# Patient Record
Sex: Male | Born: 1960
Health system: Southern US, Community
[De-identification: ages and names within clinical notes are randomized; demographics above are authoritative.]

## PROBLEM LIST (undated history)

## (undated) DIAGNOSIS — M2042 Other hammer toe(s) (acquired), left foot: Secondary | ICD-10-CM

## (undated) DIAGNOSIS — I10 Essential (primary) hypertension: Secondary | ICD-10-CM

## (undated) HISTORY — DX: Essential (primary) hypertension: I10

## (undated) HISTORY — PX: CARDIAC CATHETERIZATION: SHX172

---

## 1994-12-28 HISTORY — PX: VASECTOMY: SHX75

## 2004-12-28 DIAGNOSIS — I10 Essential (primary) hypertension: Secondary | ICD-10-CM | POA: Insufficient documentation

## 2005-12-16 DIAGNOSIS — E781 Pure hyperglyceridemia: Secondary | ICD-10-CM | POA: Insufficient documentation

## 2005-12-28 HISTORY — PX: FOOT SURGERY: SHX648

## 2007-09-13 DIAGNOSIS — Z8249 Family history of ischemic heart disease and other diseases of the circulatory system: Secondary | ICD-10-CM | POA: Insufficient documentation

## 2009-11-05 ENCOUNTER — Ambulatory Visit: Payer: Self-pay | Admitting: Unknown Physician Specialty

## 2009-11-27 ENCOUNTER — Ambulatory Visit: Payer: Self-pay | Admitting: Unknown Physician Specialty

## 2009-12-28 ENCOUNTER — Ambulatory Visit: Payer: Self-pay | Admitting: Unknown Physician Specialty

## 2012-10-07 ENCOUNTER — Ambulatory Visit: Payer: Self-pay | Admitting: Internal Medicine

## 2012-10-10 ENCOUNTER — Ambulatory Visit: Payer: Self-pay | Admitting: Internal Medicine

## 2012-10-10 LAB — CBC WITH DIFFERENTIAL/PLATELET
Basophil #: 0.1 10*3/uL (ref 0.0–0.1)
Basophil %: 0.9 %
Eosinophil #: 0.2 10*3/uL (ref 0.0–0.7)
HCT: 44.1 % (ref 40.0–52.0)
HGB: 15.6 g/dL (ref 13.0–18.0)
Lymphocyte #: 2.2 10*3/uL (ref 1.0–3.6)
MCH: 30.2 pg (ref 26.0–34.0)
MCHC: 35.5 g/dL (ref 32.0–36.0)
MCV: 85 fL (ref 80–100)
Monocyte #: 0.6 x10 3/mm (ref 0.2–1.0)
Neutrophil #: 4.3 10*3/uL (ref 1.4–6.5)
RDW: 12.8 % (ref 11.5–14.5)

## 2012-10-12 ENCOUNTER — Ambulatory Visit: Payer: Self-pay | Admitting: Internal Medicine

## 2012-10-12 HISTORY — PX: CORONARY STENT PLACEMENT: SHX1402

## 2012-10-14 ENCOUNTER — Observation Stay: Payer: Self-pay | Admitting: Internal Medicine

## 2012-10-14 LAB — CBC
HCT: 43.1 % (ref 40.0–52.0)
HGB: 15.2 g/dL (ref 13.0–18.0)
MCH: 30 pg (ref 26.0–34.0)
MCHC: 35.2 g/dL (ref 32.0–36.0)
RDW: 13 % (ref 11.5–14.5)

## 2012-10-14 LAB — COMPREHENSIVE METABOLIC PANEL
Albumin: 4 g/dL (ref 3.4–5.0)
Anion Gap: 9 (ref 7–16)
BUN: 15 mg/dL (ref 7–18)
Bilirubin,Total: 0.5 mg/dL (ref 0.2–1.0)
Creatinine: 1.02 mg/dL (ref 0.60–1.30)
EGFR (Non-African Amer.): >60
Glucose: 105 mg/dL — ABNORMAL HIGH (ref 65–99)
Osmolality: 282 (ref 275–301)
Potassium: 3.6 mmol/L (ref 3.5–5.1)
SGOT(AST): 19 U/L (ref 15–37)
Sodium: 141 mmol/L (ref 136–145)
Total Protein: 7.1 g/dL (ref 6.4–8.2)

## 2012-10-14 LAB — CK TOTAL AND CKMB (NOT AT ARMC)
CK, Total: 60 U/L (ref 35–232)
CK-MB: 1.2 ng/mL (ref 0.5–3.6)

## 2012-10-14 LAB — APTT: Activated PTT: 30 secs (ref 23.6–35.9)

## 2012-10-14 LAB — PROTIME-INR: Prothrombin Time: 12.9 secs (ref 11.5–14.7)

## 2012-10-14 LAB — TROPONIN I: Troponin-I: 0.32 ng/mL — ABNORMAL HIGH

## 2012-10-15 LAB — CK TOTAL AND CKMB (NOT AT ARMC)
CK, Total: 50 U/L (ref 35–232)
CK-MB: 1 ng/mL (ref 0.5–3.6)

## 2012-10-15 LAB — TROPONIN I: Troponin-I: 0.27 ng/mL — ABNORMAL HIGH

## 2014-01-12 LAB — BASIC METABOLIC PANEL
BUN: 20 mg/dL (ref 4–21)
Creatinine: 1 mg/dL (ref 0.6–1.3)
Glucose: 106 mg/dL
Potassium: 4.2 mmol/L (ref 3.4–5.3)
Sodium: 142 mmol/L (ref 137–147)

## 2014-01-12 LAB — LIPID PANEL
Cholesterol: 134 mg/dL (ref 0–200)
HDL: 43 mg/dL (ref 35–70)
LDL CALC: 65 mg/dL
TRIGLYCERIDES: 128 mg/dL (ref 40–160)

## 2014-01-12 LAB — HEPATIC FUNCTION PANEL: ALT: 52 U/L — AB (ref 10–40)

## 2014-01-12 LAB — HEMOGLOBIN A1C: Hgb A1c MFr Bld: 5.6 % (ref 4.0–6.0)

## 2015-04-16 NOTE — H&P (Signed)
PATIENT NAME:  Sergio King, Sergio King MR#:  [id number] DATE OF BIRTH:  [birth year]  DATE OF ADMISSION:  10/14/2012  PRIMARY CARE PHYSICIAN: Dr. Caryn Section  ED REFERRING PHYSICIAN: Dr. Renard Hamper  CARDIOLOGIST:  Dr. Clayborn Bigness and [place]: Chest pain.   HISTORY OF PRESENT ILLNESS: The patient is a 54 year old Caucasian male with history of hypertension and hyperlipidemia who underwent a cardiac catheterization due to a positive functional study on the 16th. His cardiac catheterization showed he had a 99% mid LAD lesion as well as a 75% first diagonal lesion. There was a distal circumflex 40% lesion as well as another 50% stenosis of the mid circumflex. The patient was transferred to [facility] because he was felt to be a high stent risk and there was a question of whether he needed a CABG or not. The patient went to [facility] and had a stent placed. He was discharged yesterday evening. He reports that he was doing fine yesterday and then this morning he was doing okay. He did not exert himself or do anything strenuous. He started having some cough, and then after having the cough he started having dull chest pain similar to his episode that caused him to have the work-up. It  was on the left side of his chest. He also had pain radiation to the left arm. His symptoms continued for an hour and a half. The patient came to the Emergency Department and received nitroglycerin, nitro patch. Currently he reports that his chest pain is mostly resolved. He does feel a little lightheaded in his head. He did not have any shortness of breath. No nausea or any other symptoms.   PAST MEDICAL HISTORY:  1. Coronary artery disease with two stents recently.  2. Hypertension.  3. Hyperlipidemia.  4. History of bronchitis.  5. Chronic anxiety.   PAST SURGICAL HISTORY:  1. Status post toe surgery.  2. Status post vasectomy.   ALLERGIES: None.   CURRENT MEDICATIONS:  1. Aspirin 81 mg 1 tab p.o. daily.   2. Atenolol chlorthalidone 50/25, 1/2 tab p.o. daily.  3. Atorvastatin 40 mg at bedtime.  4. Brilinta 90 mg, 1 tab p.o. b.i.d.   SOCIAL HISTORY: Does not smoke. Does not drink. Works as a Clinical cytogeneticist at [employer].   FAMILY HISTORY: Positive for coronary artery disease, myocardial infarction, hypertension, hyperlipidemia, congestive heart failure, and CABG.   REVIEW OF SYSTEMS: CONSTITUTIONAL: Denies any fevers, fatigue, or weakness. Chest pain as above. No weight loss. No weight gain.  EYES: No blurred or double vision. No pain. No redness. No glaucoma. No cataracts. ENT: No tinnitus. No ear pain. No hearing loss. No seasonal or year-round allergies. No epistaxis. No nasal discharge. No snoring. No postnasal drip. No difficulty swallowing. RESPIRATORY: Has intermittent cough. No wheezing. No hemoptysis. No dyspnea. No chronic obstructive pulmonary disease. No tuberculosis. CARDIOVASCULAR: Chest pain as above. No orthopnea. No edema. No arrhythmia. No syncope. GI: No nausea, vomiting, diarrhea. No abdominal pain. No hematemesis. No melena. No changes in bowel habits. No rectal bleeding. GU: Denies any dysuria, hematuria, renal calculus, or frequency. ENDO: Denies any polyuria, nocturia, or thyroid problems. HEME/LYMPH: Denies anemia, easy bruisability, or bleeding. SKIN: No acne. No rash. No changes in mole, hair, or skin. MUSCULOSKELETAL: Denies any pain in the neck, back, or shoulder. NEURO: No numbness. No cerebrovascular accident. No transient ischemic attack. No seizures. PSYCH: Has a history of anxiety. No insomnia. No ADD. No OCD.   PHYSICAL EXAMINATION:  VITAL SIGNS: Temperature 98.6, pulse 55, respirations 18, blood pressure 150/82, O2 100%.   GENERAL: The patient is a 54 year old white male who recently underwent cardiac catheterization with two stents, comes in with chest pain.   EVALUATION:  EKG shows no acute ST-T wave changes with normal sinus rhythm.   ASSESSMENT AND PLAN:  1. The  patient is a 54 year old white male who recently had two stents placed, comes in with chest pain. It is very unlikely this early for it to be related to any stent issues such as collapse or stenosis. At this time we will continue his aspirin and Brilinta. Dr. Clayborn Bigness has already seen the patient in the Emergency Department. He will reevaluate in the morning to see if any further evaluation needs to be done. We will follow his cardiac enzymes overnight. We will continue nitroglycerin.  2. Hypertension. Continue atenolol and  HCTZ as taking at home.  3. Hyperlipidemia. Continue atorvastatin.  4. Miscellaneous. The patient is ambulatory. We will hold off on any Lovenox or heparin.     TIME SPENT: 32 minutes.   ____________________________ Lafonda Mosses Posey Pronto, MD shp:bjt D: 10/14/2012 16:56:06 ET T: 10/14/2012 18:00:07 ET JOB#: [id number]  cc: Larken Urias H. Posey Pronto, MD, <Dictator> Kirstie Peri. Caryn Section, MD Alric Seton MD ELECTRONICALLY SIGNED 10/14/2012 19:20

## 2015-04-16 NOTE — Consult Note (Signed)
PATIENT NAME:  Sergio King, Sergio King MR#:  [id number] DATE OF BIRTH:  [birth year]  DATE OF CONSULTATION:  10/14/2012  REFERRING PHYSICIAN:  Dr. Renard Hamper, Emergency Room  CONSULTING PHYSICIAN:  Dwayne D. Callwood, MD  INDICATION FOR CONSULTATION: Unstable angina.    PRIMARY CARE PHYSICIAN: Dr. Caryn Section   HISTORY OF PRESENT ILLNESS: Sergio King is a 54 year old white male recently seen by me with known coronary disease, had cardiac cath that showed high-grade complex stenosis of the LAD. Because of the complex nature, he was transferred to [facility] for intervention. Dr. Humphrey Rolls at [facility] intervened with a complex proximal LAD involving diagonal one. The patient did reasonably well with a DES stent and went home on the 17th. On the morning of the 18th the patient complained of chest pain and angina which was of concern. He had some diaphoresis. He came to the Emergency Room and was admitted for further evaluation and care. I saw the patient in the Emergency Room for cardiac evaluation and questioned whether he needed emergent cardiac cath or emergent intervention.   REVIEW OF SYSTEMS: No blackout spells or syncope. Mild nausea. No vomiting. No fever. No chills. No weight loss or weight gain. No hemoptysis or hematemesis. No bright red blood per rectum.   PAST MEDICAL HISTORY:  1. Hypertension. 2. Coronary artery disease. 3. Bronchitis. 4. Anxiety.   FAMILY HISTORY: Coronary artery disease, myocardial infarction, angina, hypertension, hyperlipidemia, congestive heart failure, coronary artery disease, PTCA, stent, coronary artery bypass.   SOCIAL HISTORY: Divorced. Children. Still works for [employer].   PAST SURGICAL HISTORY:  1. Toe surgery. 2. Vasectomy.  MEDICATIONS:  1. Atenolol/hydrochlorothiazide 50/25 twice a day.  2. Brilinta 90 mg twice a day.  3. Fluconazole 150 a day.  4. Lorazepam 0.5 p.r.n.  5. Fish Oil. 6. He is also on a statin.   LABORATORY, DIAGNOSTIC, AND RADIOLOGICAL DATA: MET-B, CBC  unremarkable.   ASSESSMENT:  1. Unstable angina.  2. Angina.  3. Chest pain.  4. Hypertension.  5. Positive functional study.  6. Coronary artery disease.  7. Hyperlipidemia.   PLAN:  1. Continue current medications.  2. Admit.  3. Rule out for myocardial infarction with cardiac enzymes.  4. Do not recommend cardiac cath. 5. Do not recommend any further evaluation. 6. Would continue medical therapy for now.  7. I suspect this is anxiety and not true unstable angina. EKG is unremarkable. If the patient rules out, would continue to treat the patient medically. For now do not recommend cardiac cath unless the patient has persistent or recurrent symptoms.    ____________________________ Loran Senters Clayborn Bigness, MD ddc:drc D: 10/15/2012 19:04:40 ET T: 10/16/2012 12:16:44 ET JOB#: [id number]  cc: Dwayne D. Clayborn Bigness, MD, <Dictator> Yolonda Kida MD ELECTRONICALLY SIGNED 11/15/2012 12:47

## 2015-04-16 NOTE — H&P (Signed)
PATIENT NAME:  Sergio King, Sergio King MR#:  [id number] DATE OF BIRTH:  06-05-1961  DATE OF ADMISSION:  10/12/2012  REFERRING PHYSICIAN: Lelon Huh, MD  DATE OF CATHETERIZATION: 10/12/2012.  INDICATION: Angina, positive functional study.  HISTORY OF PRESENT ILLNESS: Mr. Clouse is a 54 year old white male with a strong family history of coronary artery disease and hypertension who recently complained of anginal symptoms and chest pain with exertion. Symptoms began about 3 to 4 weeks ago and have gotten progressively worse. Symptoms are worse with activity like mowing the grass or during sexual intercourse. He has significant chest pressure which only resolves with rest. He has had bilateral arm pain and numbness. No pain at rest, no blackout spells or syncope. The pain is usually moderate and once he stops and rests the pain usually resolves. No prior cardiac history.   REVIEW OF SYSTEMS: No blackout spells or syncope. No nausea or vomiting. No fever, no chills, and no sweats. No weight loss, weight gain, hemoptysis, or hematemesis. No bright red blood per rectum.   PAST MEDICAL HISTORY:  1. Hypertension. 2. Bronchitis.  3. Anxiety.   FAMILY HISTORY: Positive for coronary artery disease, myocardial infarction, angina, hypertension, hyperlipidemia, congestive heart failure, coronary artery bypass, PTCA and stent.   SOCIAL HISTORY: Divorced. Children. Still works as a Clinical cytogeneticist for [employer].   PAST SURGICAL HISTORY:  1. Toe surgery.  2. Vasectomy.   MEDICATIONS:  1. Atenolol/HCTZ 50/25 mg once a day. 2. Fluconazole 150. 3. Lorazepam 0.5 mg p.r.n.  4. Fish oil.  LABS: MET-B and CBC are unremarkable.   ASSESSMENT:  1. Angina.  2. Chest pain.  3. Hypertension.  4. Positive functional study. 5. Family history of coronary artery disease.   PLAN: We will directly proceed with cardiac catheterization with intention to treat with positive functional study with anterior apical ischemia. We  will probably proceed with continued therapy with blood pressure, probably needs aspirin. He will ultimately need to be on statin therapy. Recommend weight loss and exercise. We will base further evaluation on the cardiac catheterization. We will hopefully be able to address his coronary artery disease mechanically, hopefully percutaneously, and then develop further treatment options medically afterward.  ____________________________ Loran Senters. Clayborn Bigness, MD ddc:slb D: 10/12/2012 13:06:58 ET T: 10/12/2012 13:30:33 ET JOB#: [id number]  cc: Dwayne D. Clayborn Bigness, MD, <Dictator> Yolonda Kida MD ELECTRONICALLY SIGNED 11/15/2012 12:46

## 2015-04-16 NOTE — Discharge Summary (Signed)
PATIENT NAME:  King, Sergio MR#:  [id number] DATE OF BIRTH:  1961/04/05  DATE OF ADMISSION:  10/14/2012 DATE OF DISCHARGE:  10/15/2012  PRIMARY CARE PHYSICIAN: Dr. Caryn Section   CONSULTING PHYSICIAN: Dr. Clayborn Bigness   REASON FOR ADMISSION: Chest pain.   HOSPITAL COURSE: The patient is a 54 year old Caucasian male with a history of hypertension and hyperlipidemia, underwent cardiac cath on October 16th which showed 99% mid LAD lesion as well as 75% first diagonal lesion. The patient was sent to [facility] for CABG or stent and had placed two stents and discharged to home two days ago but the patient started to have chest pain at home after discharge so he came to the ED for further evaluation. Dr. Clayborn Bigness saw the patient in the ED and placed the patient for observation and monitor troponin level. For detailed history and physical examination, please refer to the admission note dictated by Dr. Dustin Flock. The patient's troponin level was 0.34 and has been treated with aspirin, atenolol, and HCTZ. The patient has had no more chest pain since admission. He said he felt anxious and nervous and had more chest pain so he came to the ED for further evaluation. In addition, he has anxiety. Chest pain is atypical. According to Dr. Clayborn Bigness, the patient can be discharged to home and follow-up with him as outpatient. The patient is clinically stable and will be discharged to home today.   I discussed with the patient and the patient's wife about the patient's discharge plan.   TIME SPENT: About 30 minutes.   ____________________________ Demetrios Loll, MD qc:drc D: 10/15/2012 14:42:41 ET T: 10/17/2012 10:08:38 ET JOB#: [id number]  cc: Demetrios Loll, MD, <Dictator>, Kirstie Peri. Caryn Section, MD Demetrios Loll MD ELECTRONICALLY SIGNED 10/19/2012 20:10

## 2015-06-16 ENCOUNTER — Other Ambulatory Visit: Payer: Self-pay | Admitting: Family Medicine

## 2015-06-18 ENCOUNTER — Other Ambulatory Visit: Payer: Self-pay | Admitting: Family Medicine

## 2015-06-18 DIAGNOSIS — F4320 Adjustment disorder, unspecified: Secondary | ICD-10-CM

## 2015-06-18 MED ORDER — LORAZEPAM 0.5 MG PO TABS: 0.5000 mg | ORAL_TABLET | Freq: Every day | ORAL | Status: DC

## 2015-06-18 NOTE — Telephone Encounter (Signed)
Last ov 01/10/2014.

## 2015-06-18 NOTE — Telephone Encounter (Signed)
Please call in Patient is overdue for follow up visit and needs to schedule

## 2015-06-18 NOTE — Telephone Encounter (Signed)
[facility] [place]. Patient advised and will schedule an appointment. sd

## 2015-06-18 NOTE — Telephone Encounter (Signed)
Pt contacted office for refill request on the following medications:  Lorazepam 0.5mg .  [facility]  CB#[phone]/[phone]

## 2015-06-24 ENCOUNTER — Encounter: Payer: Self-pay | Admitting: Family Medicine

## 2015-06-24 ENCOUNTER — Other Ambulatory Visit: Payer: Self-pay

## 2015-06-24 ENCOUNTER — Ambulatory Visit (INDEPENDENT_AMBULATORY_CARE_PROVIDER_SITE_OTHER): Payer: 59 | Admitting: Family Medicine

## 2015-06-24 VITALS — BP 140/80 | HR 56 | Temp 98.3°F | Resp 16 | Ht 73.0 in | Wt 226.0 lb

## 2015-06-24 DIAGNOSIS — R739 Hyperglycemia, unspecified: Secondary | ICD-10-CM | POA: Insufficient documentation

## 2015-06-24 DIAGNOSIS — E781 Pure hyperglyceridemia: Secondary | ICD-10-CM | POA: Diagnosis not present

## 2015-06-24 DIAGNOSIS — F430 Acute stress reaction: Secondary | ICD-10-CM | POA: Diagnosis not present

## 2015-06-24 DIAGNOSIS — I10 Essential (primary) hypertension: Secondary | ICD-10-CM

## 2015-06-24 DIAGNOSIS — I2510 Atherosclerotic heart disease of native coronary artery without angina pectoris: Secondary | ICD-10-CM

## 2015-06-24 DIAGNOSIS — B369 Superficial mycosis, unspecified: Secondary | ICD-10-CM | POA: Diagnosis not present

## 2015-06-24 DIAGNOSIS — Z0000 Encounter for general adult medical examination without abnormal findings: Secondary | ICD-10-CM | POA: Diagnosis not present

## 2015-06-24 DIAGNOSIS — F4320 Adjustment disorder, unspecified: Secondary | ICD-10-CM

## 2015-06-24 MED ORDER — KETOCONAZOLE 2 % EX CREA: TOPICAL_CREAM | Freq: Every day | CUTANEOUS | Status: DC | PRN

## 2015-06-24 NOTE — Progress Notes (Signed)
Patient: Sergio King, Male    DOB: 1961-04-07, 54 y.o.   MRN: [id number] Visit Date: 06/24/2015  Today's Provider: Lelon Huh, MD   Chief Complaint  Patient presents with  . Annual Exam  . Hypertension    follow up  . Hyperglycemia    follow up   Subjective:    Annual physical exam Sergio King is a 54 y.o. male who presents today for health maintenance and complete physical. He feels well. He reports exercising  Once a week mowing the lawn and gardening. Also goes to the Gym 1-2 times a week.  He reports he is sleeping fairly well.  -----------------------------------------------------------------  Hypertension, follow-up:  BP Readings from Last 3 Encounters:  01/10/14 150/80    He was last seen for hypertension 1 years ago.  BP at that visit was 150/80. Management changes since that visit include none. He reports good compliance with treatment. He is not having side effects.  He is exercising. He is adherent to low salt diet.   Outside blood pressures are not being checked. He is experiencing none.  Patient denies chest pain, chest pressure/discomfort, claudication, dyspnea, exertional chest pressure/discomfort, fatigue, irregular heart beat, lower extremity edema, near-syncope, orthopnea, palpitations, paroxysmal nocturnal dyspnea, syncope and tachypnea.   Cardiovascular risk factors include hypertension.  Use of agents associated with hypertension: none.     Weight trend: stable  Wt Readings from Last 3 Encounters:  06/24/15 226 lb (102.513 kg)  01/10/14 229 lb (103.874 kg)    Current diet: in general, a "healthy" diet    ------------------------------------------------------------------------   Lipid/Cholesterol, Follow-up:   Last seen for this1 years ago.  Management changes since that visit include  none. . Last Lipid Panel:    Component Value Date/Time   CHOL 134 01/12/2014   TRIG 128 01/12/2014   HDL 43 01/12/2014   LDLCALC  65 01/12/2014    Risk factors for vascular disease include hypertension  He reports good compliance with treatment. He is not having side effects.  Current symptoms include none and have been stable. Weight trend: increasing steadily Prior visit with dietician: no Current diet: in general, a "healthy" diet   Current exercise: gardening  Wt Readings from Last 3 Encounters:  01/10/14 229 lb (103.874 kg)    -------------------------------------------------------------------   Review of Systems  Constitutional: Negative for fever, chills, diaphoresis, appetite change and fatigue.  HENT: Positive for rhinorrhea.   Eyes: Negative for pain and visual disturbance.  Respiratory: Negative for apnea, cough, chest tightness and shortness of breath.   Endocrine: Negative for cold intolerance, heat intolerance and polyphagia.  Genitourinary: Negative for difficulty urinating.  Musculoskeletal: Negative for back pain, joint swelling and arthralgias.  Skin: Negative for rash.  Allergic/Immunologic: Positive for environmental allergies. Negative for food allergies.  Neurological: Negative for tremors, seizures, syncope, speech difficulty, weakness, light-headedness and numbness.  Hematological: Negative for adenopathy.  Psychiatric/Behavioral: Negative for behavioral problems, confusion and dysphoric mood.  All other systems reviewed and are negative.   Social History He  reports that he has never smoked. He does not have any smokeless tobacco history on file. He reports that he drinks about 10.8 oz of alcohol per week. He reports that he does not use illicit drugs.  Patient Active Problem List   Diagnosis Date Noted  . Dermatitis fungal 06/24/2015  . CAD (coronary artery disease) 06/24/2015  . Hyperglycemia 06/24/2015  . Adult situational stress disorder 06/18/2015  . Hypersomnia 08/08/2009  .  Body dermatophytosis 10/04/2007  . Fam hx-ischem heart disease 09/13/2007  .  Hyperglyceridemia, pure 12/16/2005  . Essential (primary) hypertension 12/28/2004    Past Surgical History  Procedure Laterality Date  . Coronary stent placement Left 10/12/2012    99% LAD and 75% first diagonal [facility]  . Foot surgery Right 2007    Hammer toe surgery  . Vasectomy  1996    Family History His family history includes CAD in his father.    Previous Medications   ASPIRIN EC 81 MG TABLET    Take 81 mg by mouth daily.   ATENOLOL-CHLORTHALIDONE (TENORETIC) 50-25 MG PER TABLET    Take 0.5 tablets by mouth daily.   ATORVASTATIN (LIPITOR) 40 MG TABLET    TAKE 1 TABLET BY MOUTH EVERY DAY   CITALOPRAM (CELEXA) 10 MG TABLET    Take 1 tablet by mouth daily.   FLUCONAZOLE (DIFLUCAN) 150 MG TABLET    Take 1 tablet by mouth once a week. As needed   KETOCONAZOLE (NIZORAL) 2 % CREAM    Apply externally as needed   LORAZEPAM (ATIVAN) 0.5 MG TABLET    Take 1 tablet (0.5 mg total) by mouth at bedtime.    Patient Care Team: Birdie Sons, MD as PCP - General (Family Medicine)     Objective:   Vitals: BP 140/80 mmHg  Pulse 56  Temp(Src) 98.3 F (36.8 C) (Oral)  Resp 16  Ht 6\' 1"  (1.854 m)  Wt 226 lb (102.513 kg)  BMI 29.82 kg/m2  SpO2 95%   Physical Exam  General Appearance:    Alert, cooperative, no distress, appears stated age  Head:    Normocephalic, without obvious abnormality, atraumatic  Eyes:    PERRL, conjunctiva/corneas clear, EOM's intact, fundi    benign, both eyes       Ears:    Normal TM's and external ear canals, both ears  Nose:   Nares normal, septum midline, mucosa normal, no drainage   or sinus tenderness  Throat:   Lips, mucosa, and tongue normal; teeth and gums normal  Neck:   Supple, symmetrical, trachea midline, no adenopathy;       thyroid:  No enlargement/tenderness/nodules; no carotid   bruit or JVD  Back:     Symmetric, no curvature, ROM normal, no CVA tenderness  Lungs:     Clear to auscultation bilaterally, respirations unlabored  Chest  wall:    No tenderness or deformity  Heart:    Regular rate and rhythm, S1 and S2 normal, no murmur, rub   or gallop  Abdomen:     Soft, non-tender, bowel sounds active all four quadrants,    no masses, no organomegaly  Genitalia:    deferred  Rectal:    deferred  Extremities:   Extremities normal, atraumatic, no cyanosis or edema  Pulses:   2+ and symmetric all extremities  Skin:   Skin color, texture, turgor normal, no rashes or lesions  Lymph nodes:   Cervical, supraclavicular, and axillary nodes normal  Neurologic:   CNII-XII intact. Normal strength, sensation and reflexes      throughout     Depression Screen No flowsheet data found.    Assessment & Plan:     Routine Health Maintenance and Physical Exam  Exercise Activities and Dietary recommendations Goals    None      Immunization History  Administered Date(s) Administered  . Tdap 09/28/2007    Health Maintenance  Topic Date Due  . HIV Screening  05/26/1976  .  TETANUS/TDAP  05/26/1980  . COLONOSCOPY  05/27/2011  . INFLUENZA VACCINE  07/29/2015      Discussed health benefits of physical activity, and encouraged him to engage in regular exercise appropriate for his age and condition.    -------------------------------------------------------------------- 1. Annual physical exam  - PSA - Ambulatory referral to Gastroenterology for colonoscopy, which has never had.   2. Adult situational stress disorder Well controlled on current medications.   3. Coronary artery disease involving native coronary artery of native heart without angina pectoris Asymptomatic. Compliant with medication.  Continue aggressive risk factor modification.  Continue routing follow up with [facility]  4. Essential (primary) hypertension Fairly well controlled. Continue current medications.   - Renal function panel  5. Hyperglycemia  - Hemoglobin A1c  6. Hyperglyceridemia, pure He is tolerating atorvastatin well with no  adverse effects.   - ALT - Lipid panel - TSH

## 2015-06-26 LAB — LIPID PANEL
Chol/HDL Ratio: 3.3 ratio units (ref 0.0–5.0)
Cholesterol, Total: 154 mg/dL (ref 100–199)
HDL: 46 mg/dL (ref >39)
LDL CALC: 77 mg/dL (ref 0–99)
Triglycerides: 153 mg/dL — ABNORMAL HIGH (ref 0–149)
VLDL Cholesterol Cal: 31 mg/dL (ref 5–40)

## 2015-06-26 LAB — RENAL FUNCTION PANEL
ALBUMIN: 4.6 g/dL (ref 3.5–5.5)
BUN/Creatinine Ratio: 17 (ref 9–20)
BUN: 19 mg/dL (ref 6–24)
CALCIUM: 9.4 mg/dL (ref 8.7–10.2)
CO2: 26 mmol/L (ref 18–29)
Chloride: 99 mmol/L (ref 97–108)
Creatinine, Ser: 1.14 mg/dL (ref 0.76–1.27)
GFR calc non Af Amer: 73 mL/min/{1.73_m2} (ref >59)
GFR, EST AFRICAN AMERICAN: 84 mL/min/{1.73_m2} (ref >59)
GLUCOSE: 113 mg/dL — AB (ref 65–99)
Phosphorus: 2.6 mg/dL (ref 2.5–4.5)
Potassium: 4.3 mmol/L (ref 3.5–5.2)
Sodium: 139 mmol/L (ref 134–144)

## 2015-06-26 LAB — TSH: TSH: 1.08 u[IU]/mL (ref 0.450–4.500)

## 2015-06-26 LAB — HEMOGLOBIN A1C
ESTIMATED AVERAGE GLUCOSE: 123 mg/dL
HEMOGLOBIN A1C: 5.9 % — AB (ref 4.8–5.6)

## 2015-06-26 LAB — PSA: Prostate Specific Ag, Serum: 1.1 ng/mL (ref 0.0–4.0)

## 2015-06-26 LAB — ALT: ALT: 47 IU/L — ABNORMAL HIGH (ref 0–44)

## 2015-07-07 ENCOUNTER — Other Ambulatory Visit: Payer: Self-pay | Admitting: Family Medicine

## 2015-07-21 ENCOUNTER — Other Ambulatory Visit: Payer: Self-pay | Admitting: Family Medicine

## 2015-07-29 ENCOUNTER — Other Ambulatory Visit: Payer: Self-pay | Admitting: Family Medicine

## 2015-08-28 ENCOUNTER — Other Ambulatory Visit: Payer: Self-pay | Admitting: Family Medicine

## 2015-11-04 ENCOUNTER — Other Ambulatory Visit: Payer: Self-pay | Admitting: Family Medicine

## 2015-11-04 NOTE — Telephone Encounter (Signed)
Please call in lorazepam.  

## 2015-11-04 NOTE — Telephone Encounter (Signed)
Rx called in to pharmacy. 

## 2016-03-11 ENCOUNTER — Other Ambulatory Visit: Payer: Self-pay | Admitting: Family Medicine

## 2016-03-12 NOTE — Telephone Encounter (Signed)
Rx called in to pharmacy. 

## 2016-03-12 NOTE — Telephone Encounter (Signed)
Please call in lorazepam.  

## 2016-07-09 ENCOUNTER — Other Ambulatory Visit: Payer: Self-pay | Admitting: Family Medicine

## 2016-07-23 ENCOUNTER — Other Ambulatory Visit: Payer: Self-pay | Admitting: Family Medicine

## 2016-08-13 ENCOUNTER — Other Ambulatory Visit: Payer: Self-pay | Admitting: Family Medicine

## 2016-09-14 ENCOUNTER — Other Ambulatory Visit: Payer: Self-pay | Admitting: Family Medicine

## 2016-09-22 ENCOUNTER — Other Ambulatory Visit: Payer: Self-pay | Admitting: Family Medicine

## 2016-10-07 ENCOUNTER — Other Ambulatory Visit: Payer: Self-pay | Admitting: Family Medicine

## 2016-10-18 ENCOUNTER — Other Ambulatory Visit: Payer: Self-pay | Admitting: Family Medicine

## 2016-11-07 ENCOUNTER — Other Ambulatory Visit: Payer: Self-pay | Admitting: Family Medicine

## 2016-11-16 ENCOUNTER — Other Ambulatory Visit: Payer: Self-pay | Admitting: Family Medicine

## 2016-12-19 ENCOUNTER — Other Ambulatory Visit: Payer: Self-pay | Admitting: Family Medicine

## 2016-12-31 ENCOUNTER — Other Ambulatory Visit: Payer: Self-pay | Admitting: Family Medicine

## 2017-01-01 NOTE — Telephone Encounter (Signed)
[facility], CMA

## 2017-01-08 ENCOUNTER — Other Ambulatory Visit: Payer: Self-pay | Admitting: Family Medicine

## 2017-01-20 ENCOUNTER — Other Ambulatory Visit: Payer: Self-pay | Admitting: Family Medicine

## 2017-02-03 ENCOUNTER — Other Ambulatory Visit: Payer: Self-pay | Admitting: Family Medicine

## 2017-02-08 DIAGNOSIS — J029 Acute pharyngitis, unspecified: Secondary | ICD-10-CM | POA: Diagnosis not present

## 2017-02-12 ENCOUNTER — Ambulatory Visit (INDEPENDENT_AMBULATORY_CARE_PROVIDER_SITE_OTHER): Payer: 59 | Admitting: Family Medicine

## 2017-02-12 ENCOUNTER — Encounter: Payer: Self-pay | Admitting: Family Medicine

## 2017-02-12 VITALS — BP 138/82 | HR 72 | Temp 98.2°F | Resp 16 | Ht 72.0 in | Wt 223.0 lb

## 2017-02-12 DIAGNOSIS — Z1211 Encounter for screening for malignant neoplasm of colon: Secondary | ICD-10-CM | POA: Diagnosis not present

## 2017-02-12 DIAGNOSIS — I10 Essential (primary) hypertension: Secondary | ICD-10-CM

## 2017-02-12 DIAGNOSIS — E781 Pure hyperglyceridemia: Secondary | ICD-10-CM

## 2017-02-12 DIAGNOSIS — Z0000 Encounter for general adult medical examination without abnormal findings: Secondary | ICD-10-CM

## 2017-02-12 DIAGNOSIS — I2510 Atherosclerotic heart disease of native coronary artery without angina pectoris: Secondary | ICD-10-CM

## 2017-02-12 DIAGNOSIS — Z23 Encounter for immunization: Secondary | ICD-10-CM

## 2017-02-12 DIAGNOSIS — Z113 Encounter for screening for infections with a predominantly sexual mode of transmission: Secondary | ICD-10-CM | POA: Diagnosis not present

## 2017-02-12 DIAGNOSIS — Z125 Encounter for screening for malignant neoplasm of prostate: Secondary | ICD-10-CM | POA: Diagnosis not present

## 2017-02-12 DIAGNOSIS — R739 Hyperglycemia, unspecified: Secondary | ICD-10-CM

## 2017-02-12 NOTE — Progress Notes (Signed)
Patient: Sergio King, Male    DOB: [birth year], 56 y.o.   MRN: [id number] Visit Date: 02/12/2017  Today's Provider: Lelon Huh, MD   Chief Complaint  Patient presents with  . Annual Exam   Subjective:    Annual physical exam Sergio King is a 56 y.o. male who presents today for health maintenance and complete physical. He feels well. He reports exercising . He reports he is sleeping fairly well.    Hypertension, follow-up:  BP Readings from Last 3 Encounters:  02/12/17 138/82  06/24/15 140/80  01/10/14 (!) 150/80    He was last seen for hypertension 1 years ago.  BP at that visit was 140/80. Management since that visit includes no changes. He reports good compliance with treatment. He is not having side effects.  He is exercising. He is adherent to low salt diet.   Outside blood pressures are checked occasionally. Patient denies chest pressure/discomfort, exertional chest pressure/discomfort, lower extremity edema and tachypnea.    Weight trend: stable Wt Readings from Last 3 Encounters:  02/12/17 223 lb (101.2 kg)  06/24/15 226 lb (102.5 kg)  01/10/14 229 lb (103.9 kg)    Current diet: well balanced   Hyperglycemia, follow up: Patient was last seen in the office about 1 year ago. No changes were made in medications. Patient was advised to avoid sweet and starchy foods.   Is scheduled for cardiology follow up with Dr. Clayborn Bigness next week. No chest pain or trouble breathing. Has had a cough for last week and seen at [facility] and prescribed Zpack. He is much better, but still has cough.   He is also concerned about possible STD exposure from girlfriend he recently broke up with.   Review of Systems  Constitutional: Negative.   HENT: Positive for postnasal drip and sore throat.   Eyes: Negative.   Respiratory: Positive for cough.   Cardiovascular: Negative.   Gastrointestinal: Negative.   Endocrine: Negative.   Genitourinary: Negative.     Musculoskeletal: Negative.   Skin: Negative.   Allergic/Immunologic: Negative.   Neurological: Negative.   Hematological: Negative.   Psychiatric/Behavioral: Negative.     Social History      He  reports that he has never smoked. He has never used smokeless tobacco. He reports that he drinks about 10.8 oz of alcohol per week . He reports that he does not use drugs.       Social History   Social History  . Marital status: Divorced    Spouse name: N/A  . Number of children: 2  . Years of education: N/A   Occupational History  . Employed [employer]    full time   Social History Main Topics  . Smoking status: Never Smoker  . Smokeless tobacco: Never Used  . Alcohol use 10.8 oz/week    18 Standard drinks or equivalent per week  . Drug use: No  . Sexual activity: Not Asked   Other Topics Concern  . None   Social History Narrative  . None    No past medical history on file.   Patient Active Problem List   Diagnosis Date Noted  . Dermatitis fungal 06/24/2015  . CAD (coronary artery disease) 06/24/2015  . Hyperglycemia 06/24/2015  . Adult situational stress disorder 06/18/2015  . Hypersomnia 08/08/2009  . Body dermatophytosis 10/04/2007  . Fam hx-ischem heart disease 09/13/2007  . Hyperglyceridemia, pure 12/16/2005  . Essential (primary) hypertension 12/28/2004    Past  Surgical History:  Procedure Laterality Date  . CORONARY STENT PLACEMENT Left 10/12/2012   99% LAD and 75% first diagonal [facility]  . FOOT SURGERY Right 2007   Hammer toe surgery  . VASECTOMY  1996    Family History        Family Status  Relation Status  . Mother Alive   DDD, Gallbladder removed  . Father Alive  . Brother Alive  . Brother Alive  . Brother Alive        His family history includes CAD in his father.     Allergies  Allergen Reactions  . Penicillins     Other reaction(s): Stomach Ache     Current Outpatient Prescriptions:  .  aspirin EC 81 MG tablet, Take 81 mg by  mouth daily., Disp: , Rfl:  .  atenolol-chlorthalidone (TENORETIC) 50-25 MG tablet, TAKE 1/2 TABLET BY MOUTH EVERY DAY. **MUST MAKE APPT FOR FURTHER REFILLS**, Disp: 15 tablet, Rfl: 0 .  atorvastatin (LIPITOR) 40 MG tablet, TAKE 1 TABLET BY MOUTH EVERY DAY, Disp: 30 tablet, Rfl: 0 .  citalopram (CELEXA) 10 MG tablet, TAKE 1 TABLET (10 MG TOTAL) BY MOUTH DAILY. PATIENT NEEDS TO SCHEDULE OFFICE VISIT FOR FOLLOW UP, Disp: 30 tablet, Rfl: 0 .  ketoconazole (NIZORAL) 2 % cream, APPLY TOPICALLY DAILY AS NEEDED FOR IRRITATION. APPLY EXTERNALLY AS NEEDED, Disp: 30 g, Rfl: 3 .  LORazepam (ATIVAN) 0.5 MG tablet, TAKE 1 TABLET BY MOUTH EVERY NIGHT AT BEDTIME, Disp: 30 tablet, Rfl: 5   Patient Care Team: Birdie Sons, MD as PCP - General (Family Medicine) Yolonda Kida, MD as Consulting Physician (Cardiology)      Objective:   Vitals: BP 138/82 (BP Location: Right Arm, Patient Position: Sitting, Cuff Size: Large)   Pulse 72   Temp 98.2 F (36.8 C)   Resp 16   Ht 6' (1.829 m)   Wt 223 lb (101.2 kg)   BMI 30.24 kg/m    Physical Exam   General Appearance:    Alert, cooperative, no distress, appears stated age  Head:    Normocephalic, without obvious abnormality, atraumatic  Eyes:    PERRL, conjunctiva/corneas clear, EOM's intact, fundi    benign, both eyes       Ears:    Normal TM's and external ear canals, both ears  Nose:   Nares normal, septum midline, mucosa normal, no drainage   or sinus tenderness  Throat:   Lips, mucosa, and tongue normal; teeth and gums normal  Neck:   Supple, symmetrical, trachea midline, no adenopathy;       thyroid:  No enlargement/tenderness/nodules; no carotid   bruit or JVD  Back:     Symmetric, no curvature, ROM normal, no CVA tenderness  Lungs:     Clear to auscultation bilaterally, respirations unlabored  Chest wall:    No tenderness or deformity  Heart:    Regular rate and rhythm, S1 and S2 normal, no murmur, rub   or gallop  Abdomen:     Soft,  non-tender, bowel sounds active all four quadrants,    no masses, no organomegaly  Genitalia:    deferred  Rectal:    deferred  Extremities:   Extremities normal, atraumatic, no cyanosis or edema  Pulses:   2+ and symmetric all extremities  Skin:   Skin color, texture, turgor normal, no rashes or lesions  Lymph nodes:   Cervical, supraclavicular, and axillary nodes normal  Neurologic:   CNII-XII intact. Normal strength, sensation and  reflexes      throughout     Depression Screen PHQ 2/9 Scores 06/24/2015  PHQ - 2 Score 0  PHQ- 9 Score 2      Assessment & Plan:     Routine Health Maintenance and Physical Exam  Exercise Activities and Dietary recommendations Goals    None      Immunization History  Administered Date(s) Administered  . Tdap 09/28/2007    Health Maintenance  Topic Date Due  . Hepatitis C Screening  04-30-1961  . HIV Screening  05/26/1976  . COLONOSCOPY  05/27/2011  . INFLUENZA VACCINE  07/28/2016  . TETANUS/TDAP  09/27/2017     Discussed health benefits of physical activity, and encouraged him to engage in regular exercise appropriate for his age and condition.     1. Annual physical exam He reports was given flu vaccine at work.   2. Coronary artery disease involving native coronary artery of native heart without angina pectoris Asymptomatic. Compliant with medication.  Continue aggressive risk factor modification.  Follow up [place] as scheduled.  - Comprehensive metabolic panel  3. Essential (primary) hypertension Well controlled.  Continue current medications.   - Comprehensive metabolic panel  4. Hyperglyceridemia, pure He is tolerating atorvastatin well with no adverse effects.   - Comprehensive metabolic panel - Lipid panel  5. Hyperglycemia  - Hemoglobin A1c  6. Prostate cancer screening  - PSA  7. Need for Tdap vaccination Declined vaccine today since he still does not feel well from recent URI.   8. Colon cancer  screening  - Ambulatory referral to Gastroenterology  9. Screen for STD (sexually transmitted disease)  - Hepatitis C antibody - RPR - HIV antibody (with reflex)   Lelon Huh, MD  [place]

## 2017-02-13 LAB — COMPREHENSIVE METABOLIC PANEL
ALBUMIN: 4.7 g/dL (ref 3.5–5.5)
ALK PHOS: 73 IU/L (ref 39–117)
ALT: 58 IU/L — ABNORMAL HIGH (ref 0–44)
AST: 37 IU/L (ref 0–40)
Albumin/Globulin Ratio: 2.4 — ABNORMAL HIGH (ref 1.2–2.2)
BILIRUBIN TOTAL: 0.6 mg/dL (ref 0.0–1.2)
BUN / CREAT RATIO: 12 (ref 9–20)
BUN: 13 mg/dL (ref 6–24)
CO2: 25 mmol/L (ref 18–29)
CREATININE: 1.06 mg/dL (ref 0.76–1.27)
Calcium: 9.2 mg/dL (ref 8.7–10.2)
Chloride: 99 mmol/L (ref 96–106)
GFR calc Af Amer: 91 mL/min/{1.73_m2} (ref >59)
GFR calc non Af Amer: 79 mL/min/{1.73_m2} (ref >59)
GLOBULIN, TOTAL: 2 g/dL (ref 1.5–4.5)
Glucose: 95 mg/dL (ref 65–99)
Potassium: 4 mmol/L (ref 3.5–5.2)
SODIUM: 142 mmol/L (ref 134–144)
Total Protein: 6.7 g/dL (ref 6.0–8.5)

## 2017-02-13 LAB — HIV ANTIBODY (ROUTINE TESTING W REFLEX): HIV SCREEN 4TH GENERATION: NONREACTIVE

## 2017-02-13 LAB — LIPID PANEL
CHOL/HDL RATIO: 2.7 ratio (ref 0.0–5.0)
CHOLESTEROL TOTAL: 116 mg/dL (ref 100–199)
HDL: 43 mg/dL (ref >39)
LDL CALC: 55 mg/dL (ref 0–99)
Triglycerides: 90 mg/dL (ref 0–149)
VLDL Cholesterol Cal: 18 mg/dL (ref 5–40)

## 2017-02-13 LAB — HEMOGLOBIN A1C
Est. average glucose Bld gHb Est-mCnc: 108 mg/dL
HEMOGLOBIN A1C: 5.4 % (ref 4.8–5.6)

## 2017-02-13 LAB — HEPATITIS C ANTIBODY

## 2017-02-13 LAB — RPR: RPR Ser Ql: NONREACTIVE

## 2017-02-13 LAB — PSA: PROSTATE SPECIFIC AG, SERUM: 1.3 ng/mL (ref 0.0–4.0)

## 2017-02-16 DIAGNOSIS — I10 Essential (primary) hypertension: Secondary | ICD-10-CM | POA: Diagnosis not present

## 2017-02-16 DIAGNOSIS — I2510 Atherosclerotic heart disease of native coronary artery without angina pectoris: Secondary | ICD-10-CM | POA: Diagnosis not present

## 2017-02-16 DIAGNOSIS — I209 Angina pectoris, unspecified: Secondary | ICD-10-CM | POA: Diagnosis not present

## 2017-03-01 ENCOUNTER — Other Ambulatory Visit: Payer: Self-pay | Admitting: Family Medicine

## 2017-03-05 DIAGNOSIS — L821 Other seborrheic keratosis: Secondary | ICD-10-CM | POA: Diagnosis not present

## 2017-03-05 DIAGNOSIS — D485 Neoplasm of uncertain behavior of skin: Secondary | ICD-10-CM | POA: Diagnosis not present

## 2017-03-05 DIAGNOSIS — L570 Actinic keratosis: Secondary | ICD-10-CM | POA: Diagnosis not present

## 2017-03-20 DIAGNOSIS — J019 Acute sinusitis, unspecified: Secondary | ICD-10-CM | POA: Diagnosis not present

## 2017-03-20 DIAGNOSIS — J209 Acute bronchitis, unspecified: Secondary | ICD-10-CM | POA: Diagnosis not present

## 2017-04-12 ENCOUNTER — Other Ambulatory Visit: Payer: Self-pay | Admitting: Family Medicine

## 2017-04-12 MED ORDER — ATENOLOL-CHLORTHALIDONE 50-25 MG PO TABS: 0.5000 | ORAL_TABLET | Freq: Every day | ORAL | 3 refills | Status: DC

## 2017-04-12 NOTE — Telephone Encounter (Signed)
[facility] pharmacy faxed a request for a 90-days supply for the following medication. Thanks CC  atenolol-chlorthalidone (TENORETIC) 50-25 MG tablet

## 2017-08-17 ENCOUNTER — Telehealth: Payer: Self-pay

## 2017-08-17 DIAGNOSIS — I2510 Atherosclerotic heart disease of native coronary artery without angina pectoris: Secondary | ICD-10-CM | POA: Diagnosis not present

## 2017-08-17 DIAGNOSIS — I10 Essential (primary) hypertension: Secondary | ICD-10-CM | POA: Diagnosis not present

## 2017-08-17 DIAGNOSIS — I209 Angina pectoris, unspecified: Secondary | ICD-10-CM | POA: Diagnosis not present

## 2017-08-17 NOTE — Telephone Encounter (Signed)
Pt given contact information for [facility]

## 2017-08-17 NOTE — Telephone Encounter (Signed)
Patient is requesting to proceed with GI referral for colonoscopy. Referral was ordered in February. He prefers appointment on Thursday or Friday.  CB#[phone]

## 2017-08-18 ENCOUNTER — Telehealth: Payer: Self-pay | Admitting: Gastroenterology

## 2017-08-18 NOTE — Telephone Encounter (Signed)
Patient called to schedule colonoscopy.

## 2017-08-19 ENCOUNTER — Telehealth: Payer: Self-pay

## 2017-08-19 ENCOUNTER — Other Ambulatory Visit: Payer: Self-pay

## 2017-08-19 DIAGNOSIS — Z1212 Encounter for screening for malignant neoplasm of rectum: Principal | ICD-10-CM

## 2017-08-19 DIAGNOSIS — Z1211 Encounter for screening for malignant neoplasm of colon: Secondary | ICD-10-CM

## 2017-08-19 NOTE — Telephone Encounter (Signed)
Gastroenterology Pre-Procedure Review  Request Date: 9/7 Requesting Physician: Dr. Allen Norris  PATIENT REVIEW QUESTIONS: The patient responded to the following health history questions as indicated:    1. Are you having any GI issues? no 2. Do you have a personal history of Polyps? no 3. Do you have a family history of Colon Cancer or Polyps? no 4. Diabetes Mellitus? no 5. Joint replacements in the past 12 months?no 6. Major health problems in the past 3 months?no 7. Any artificial heart valves, MVP, or defibrillator?yes (Stents)    MEDICATIONS & ALLERGIES:    Patient reports the following regarding taking any anticoagulation/antiplatelet therapy:   Plavix, Coumadin, Eliquis, Xarelto, Lovenox, Pradaxa, Brilinta, or Effient? no Aspirin? no  Patient confirms/reports the following medications:  Current Outpatient Prescriptions  Medication Sig Dispense Refill  . aspirin EC 81 MG tablet Take 81 mg by mouth daily.    Marland Kitchen atenolol-chlorthalidone (TENORETIC) 50-25 MG tablet Take 0.5 tablets by mouth daily. 45 tablet 3  . atorvastatin (LIPITOR) 40 MG tablet TAKE 1 TABLET BY MOUTH EVERY DAY 30 tablet 11  . citalopram (CELEXA) 10 MG tablet Take 1 tablet (10 mg total) by mouth daily. 30 tablet 11  . ketoconazole (NIZORAL) 2 % cream APPLY TOPICALLY DAILY AS NEEDED FOR IRRITATION. APPLY EXTERNALLY AS NEEDED 30 g 3  . LORazepam (ATIVAN) 0.5 MG tablet TAKE 1 TABLET BY MOUTH EVERY NIGHT AT BEDTIME 30 tablet 5   No current facility-administered medications for this visit.     Patient confirms/reports the following allergies:  Allergies  Allergen Reactions  . Penicillins     Other reaction(s): Stomach Ache    No orders of the defined types were placed in this encounter.   AUTHORIZATION INFORMATION Primary Insurance: 1D#: Group #:  Secondary Insurance: 1D#: Group #:  SCHEDULE INFORMATION: Date: 9/7 Time: Location: [place]

## 2017-08-20 ENCOUNTER — Other Ambulatory Visit: Payer: Self-pay

## 2017-08-20 ENCOUNTER — Ambulatory Visit: Payer: 59 | Admitting: Family Medicine

## 2017-08-23 ENCOUNTER — Ambulatory Visit (INDEPENDENT_AMBULATORY_CARE_PROVIDER_SITE_OTHER): Payer: 59 | Admitting: Family Medicine

## 2017-08-23 ENCOUNTER — Encounter: Payer: Self-pay | Admitting: Family Medicine

## 2017-08-23 VITALS — BP 138/80 | HR 60 | Temp 98.5°F | Resp 16 | Ht 72.0 in | Wt 228.0 lb

## 2017-08-23 DIAGNOSIS — Z114 Encounter for screening for human immunodeficiency virus [HIV]: Secondary | ICD-10-CM | POA: Diagnosis not present

## 2017-08-23 DIAGNOSIS — J069 Acute upper respiratory infection, unspecified: Secondary | ICD-10-CM

## 2017-08-23 NOTE — Progress Notes (Signed)
Patient: Sergio King Male    DOB: 1961/08/18   56 y.o.   MRN: [id number] Visit Date: 08/23/2017  Today's Provider: Lelon Huh, MD   Chief Complaint  Patient presents with  . Cough    x 2-3 weeks   Subjective:    Cough  This is a new problem. Episode onset: 2-3 weeks. The problem has been unchanged. Associated symptoms include headaches and a sore throat (a few weeks ago; now resolved). Pertinent negatives include no chest pain, chills, fever, rhinorrhea, shortness of breath or wheezing.  He states he had unprotected intercourse about six weeks ago and is concerned about STD exposure. Denies any urinary symptoms, but wants to be tested for HIV. Cough is improving a bit.      Allergies  Allergen Reactions  . Penicillins     Other reaction(s): Stomach Ache     Current Outpatient Prescriptions:  .  aspirin EC 81 MG tablet, Take 81 mg by mouth daily., Disp: , Rfl:  .  atenolol-chlorthalidone (TENORETIC) 50-25 MG tablet, Take 0.5 tablets by mouth daily., Disp: 45 tablet, Rfl: 3 .  atorvastatin (LIPITOR) 40 MG tablet, TAKE 1 TABLET BY MOUTH EVERY DAY, Disp: 30 tablet, Rfl: 11 .  citalopram (CELEXA) 10 MG tablet, Take 1 tablet (10 mg total) by mouth daily., Disp: 30 tablet, Rfl: 11 .  ketoconazole (NIZORAL) 2 % cream, APPLY TOPICALLY DAILY AS NEEDED FOR IRRITATION. APPLY EXTERNALLY AS NEEDED, Disp: 30 g, Rfl: 3 .  LORazepam (ATIVAN) 0.5 MG tablet, TAKE 1 TABLET BY MOUTH EVERY NIGHT AT BEDTIME, Disp: 30 tablet, Rfl: 5  Review of Systems  Constitutional: Negative for appetite change, chills, diaphoresis, fatigue and fever.  HENT: Positive for sore throat (a few weeks ago; now resolved). Negative for congestion, rhinorrhea, sinus pain and sinus pressure.   Respiratory: Positive for cough. Negative for chest tightness, shortness of breath and wheezing.   Cardiovascular: Negative for chest pain and palpitations.  Gastrointestinal: Negative for abdominal pain, diarrhea,  nausea and vomiting.  Musculoskeletal: Positive for arthralgias.  Neurological: Positive for headaches.    Social History  Substance Use Topics  . Smoking status: Never Smoker  . Smokeless tobacco: Never Used  . Alcohol use 10.8 oz/week    18 Standard drinks or equivalent per week     Comment: 1 case of beer per week (18-24 beers)   Objective:   BP 138/80 (BP Location: Right Arm, Cuff Size: Large)   Pulse 60   Temp 98.5 F (36.9 C) (Oral)   Resp 16   Ht 6' (1.829 m)   Wt 228 lb (103.4 kg)   SpO2 96% Comment: room air  BMI 30.92 kg/m  Vitals:   08/23/17 1610 08/23/17 1613  BP: (!) 146/82 138/80  Pulse: 60   Resp: 16   Temp: 98.5 F (36.9 C)   TempSrc: Oral   SpO2: 96%   Weight: 228 lb (103.4 kg)   Height: 6' (1.829 m)      Physical Exam   General Appearance:    Alert, cooperative, no distress  Eyes:    PERRL, conjunctiva/corneas clear, EOM's intact       Lungs:     Clear to auscultation bilaterally, respirations unlabored  Heart:    Regular rate and rhythm  Neurologic:   Awake, alert, oriented x 3. No apparent focal neurological           defect.  Assessment & Plan:     1. Upper respiratory tract infection, unspecified type Seems to be improving. No additional treatment warranted at this time.   2. Encounter for screening for HIV  - RPR - HIV antibody (with reflex)     The entirety of the information documented in the History of Present Illness, Review of Systems and Physical Exam were personally obtained by me. Portions of this information were initially documented by Meyer Cory, CMA and reviewed by me for thoroughness and accuracy.    Lelon Huh, MD  [facility]

## 2017-08-24 LAB — HIV ANTIBODY (ROUTINE TESTING W REFLEX): HIV Screen 4th Generation wRfx: NONREACTIVE

## 2017-08-24 LAB — RPR: RPR Ser Ql: NONREACTIVE

## 2017-09-09 DIAGNOSIS — H6690 Otitis media, unspecified, unspecified ear: Secondary | ICD-10-CM | POA: Diagnosis not present

## 2017-09-09 DIAGNOSIS — J019 Acute sinusitis, unspecified: Secondary | ICD-10-CM | POA: Diagnosis not present

## 2017-09-20 ENCOUNTER — Encounter: Payer: Self-pay | Admitting: *Deleted

## 2017-09-23 NOTE — Discharge Instructions (Signed)
General Anesthesia, Adult, Care After °These instructions provide you with information about caring for yourself after your procedure. Your health care provider may also give you more specific instructions. Your treatment has been planned according to current medical practices, but problems sometimes occur. Call your health care provider if you have any problems or questions after your procedure. °What can I expect after the procedure? °After the procedure, it is common to have: °· Vomiting. °· A sore throat. °· Mental slowness. ° °It is common to feel: °· Nauseous. °· Cold or shivery. °· Sleepy. °· Tired. °· Sore or achy, even in parts of your body where you did not have surgery. ° °Follow these instructions at home: °For at least 24 hours after the procedure: °· Do not: °? Participate in activities where you could fall or become injured. °? Drive. °? Use heavy machinery. °? Drink alcohol. °? Take sleeping pills or medicines that cause drowsiness. °? Make important decisions or sign legal documents. °? Take care of children on your own. °· Rest. °Eating and drinking °· If you vomit, drink water, juice, or soup when you can drink without vomiting. °· Drink enough fluid to keep your urine clear or pale yellow. °· Make sure you have little or no nausea before eating solid foods. °· Follow the diet recommended by your health care provider. °General instructions °· Have a responsible adult stay with you until you are awake and alert. °· Return to your normal activities as told by your health care provider. Ask your health care provider what activities are safe for you. °· Take over-the-counter and prescription medicines only as told by your health care provider. °· If you smoke, do not smoke without supervision. °· Keep all follow-up visits as told by your health care provider. This is important. °Contact a health care provider if: °· You continue to have nausea or vomiting at home, and medicines are not helpful. °· You  cannot drink fluids or start eating again. °· You cannot urinate after 8-12 hours. °· You develop a skin rash. °· You have fever. °· You have increasing redness at the site of your procedure. °Get help right away if: °· You have difficulty breathing. °· You have chest pain. °· You have unexpected bleeding. °· You feel that you are having a life-threatening or urgent problem. °This information is not intended to replace advice given to you by your health care provider. Make sure you discuss any questions you have with your health care provider. °Document Released: 03/22/2001 Document Revised: 05/18/2016 Document Reviewed: 11/28/2015 °Elsevier Interactive Patient Education © 2018 [employer] Inc. ° °

## 2017-09-24 ENCOUNTER — Ambulatory Visit: Payer: 59 | Admitting: Certified Registered"

## 2017-09-24 ENCOUNTER — Ambulatory Visit
Admission: RE | Admit: 2017-09-24 | Discharge: 2017-09-24 | Disposition: A | Discharge status: Home / Self Care | Payer: 59 | Source: Ambulatory Visit | Attending: Gastroenterology | Admitting: Gastroenterology

## 2017-09-24 ENCOUNTER — Encounter
Admission: RE | Disposition: A | Discharge status: Home / Self Care | Payer: Self-pay | Source: Ambulatory Visit | Attending: Gastroenterology

## 2017-09-24 DIAGNOSIS — Z1211 Encounter for screening for malignant neoplasm of colon: Secondary | ICD-10-CM | POA: Insufficient documentation

## 2017-09-24 DIAGNOSIS — Z9852 Vasectomy status: Secondary | ICD-10-CM | POA: Diagnosis not present

## 2017-09-24 DIAGNOSIS — Z1212 Encounter for screening for malignant neoplasm of rectum: Secondary | ICD-10-CM | POA: Diagnosis not present

## 2017-09-24 DIAGNOSIS — Z79899 Other long term (current) drug therapy: Secondary | ICD-10-CM | POA: Insufficient documentation

## 2017-09-24 DIAGNOSIS — Z9889 Other specified postprocedural states: Secondary | ICD-10-CM | POA: Insufficient documentation

## 2017-09-24 DIAGNOSIS — Z955 Presence of coronary angioplasty implant and graft: Secondary | ICD-10-CM | POA: Diagnosis not present

## 2017-09-24 DIAGNOSIS — Z860101 Personal history of adenomatous and serrated colon polyps: Secondary | ICD-10-CM

## 2017-09-24 DIAGNOSIS — I2510 Atherosclerotic heart disease of native coronary artery without angina pectoris: Secondary | ICD-10-CM | POA: Insufficient documentation

## 2017-09-24 DIAGNOSIS — Z880 Allergy status to penicillin: Secondary | ICD-10-CM | POA: Insufficient documentation

## 2017-09-24 DIAGNOSIS — D125 Benign neoplasm of sigmoid colon: Secondary | ICD-10-CM | POA: Insufficient documentation

## 2017-09-24 DIAGNOSIS — Z8249 Family history of ischemic heart disease and other diseases of the circulatory system: Secondary | ICD-10-CM | POA: Diagnosis not present

## 2017-09-24 DIAGNOSIS — Z7189 Other specified counseling: Secondary | ICD-10-CM

## 2017-09-24 DIAGNOSIS — D123 Benign neoplasm of transverse colon: Secondary | ICD-10-CM

## 2017-09-24 DIAGNOSIS — I10 Essential (primary) hypertension: Secondary | ICD-10-CM | POA: Diagnosis not present

## 2017-09-24 DIAGNOSIS — Z86010 Personal history of colonic polyps: Secondary | ICD-10-CM

## 2017-09-24 DIAGNOSIS — Z7982 Long term (current) use of aspirin: Secondary | ICD-10-CM | POA: Insufficient documentation

## 2017-09-24 HISTORY — PX: COLONOSCOPY WITH PROPOFOL: SHX5780

## 2017-09-24 HISTORY — DX: Other hammer toe(s) (acquired), left foot: M20.42

## 2017-09-24 HISTORY — PX: POLYPECTOMY: SHX5525

## 2017-09-24 LAB — HM COLONOSCOPY

## 2017-09-24 SURGERY — COLONOSCOPY WITH PROPOFOL
Anesthesia: General | Wound class: Dirty or Infected

## 2017-09-24 MED ORDER — PROPOFOL 10 MG/ML IV BOLUS
INTRAVENOUS | Status: DC | PRN
  Administered 2017-09-24 (×3): 50 mg via INTRAVENOUS
  Administered 2017-09-24: 100 mg via INTRAVENOUS
  Administered 2017-09-24: 50 mg via INTRAVENOUS

## 2017-09-24 MED ORDER — STERILE WATER FOR IRRIGATION IR SOLN
Status: DC | PRN
  Administered 2017-09-24: 11:00:00

## 2017-09-24 MED ORDER — HYDRALAZINE HCL 20 MG/ML IJ SOLN
INTRAMUSCULAR | Status: DC | PRN
  Administered 2017-09-24: 5 mg via INTRAVENOUS

## 2017-09-24 MED ORDER — SODIUM CHLORIDE 0.9 % IV SOLN: INTRAVENOUS | Status: DC

## 2017-09-24 MED ORDER — LACTATED RINGERS IV SOLN
INTRAVENOUS | Status: DC
  Administered 2017-09-24: 10:00:00 via INTRAVENOUS

## 2017-09-24 MED ORDER — LIDOCAINE HCL (CARDIAC) 20 MG/ML IV SOLN
INTRAVENOUS | Status: DC | PRN
  Administered 2017-09-24: 20 mg via INTRAVENOUS

## 2017-09-24 SURGICAL SUPPLY — 23 items

## 2017-09-24 NOTE — Op Note (Signed)
[facility] Gastroenterology Patient Name: Sergio King Procedure Date: 09/24/2017 10:39 AM MRN: [id number] Account #: [id number] Date of Birth: 02-Jun-1961 Admit Type: Outpatient Age: 56 Room: [facility] OR ROOM 01 Gender: Male Note Status: Finalized Procedure:            Colonoscopy Indications:          Screening for colorectal malignant neoplasm Providers:            Lucilla Lame MD, MD Referring MD:         Kirstie Peri. Caryn Section, MD (Referring MD) Medicines:            Propofol per Anesthesia Complications:        No immediate complications. Procedure:            Pre-Anesthesia Assessment:                       - Prior to the procedure, a History and Physical was                        performed, and patient medications and allergies were                        reviewed. The patient's tolerance of previous                        anesthesia was also reviewed. The risks and benefits of                        the procedure and the sedation options and risks were                        discussed with the patient. All questions were                        answered, and informed consent was obtained. Prior                        Anticoagulants: The patient has taken no previous                        anticoagulant or antiplatelet agents. ASA Grade                        Assessment: II - A patient with mild systemic disease.                        After reviewing the risks and benefits, the patient was                        deemed in satisfactory condition to undergo the                        procedure.                       After obtaining informed consent, the colonoscope was                        passed under direct vision. Throughout the procedure,  the patient's blood pressure, pulse, and oxygen                        saturations were monitored continuously. The Olympus                        CF-HQ190L Colonoscope (S#. [id number]) was introduced                      through the anus and advanced to the the cecum,                        identified by the appendiceal orifice, ileocecal valve                        and palpation. The colonoscopy was performed without                        difficulty. The patient tolerated the procedure well.                        The quality of the bowel preparation was excellent. Findings:      The perianal and digital rectal examinations were normal.      A 8 mm polyp was found in the sigmoid colon. The polyp was pedunculated.       The polyp was removed with a cold snare. Resection and retrieval were       complete.      A 7 mm polyp was found in the transverse colon. The polyp was sessile.       The polyp was removed with a cold snare. Resection and retrieval were       complete. Impression:           - One 8 mm polyp in the sigmoid colon, removed with a                        cold snare. Resected and retrieved.                       - One 7 mm polyp in the transverse colon, removed with                        a cold snare. Resected and retrieved. Recommendation:       - Discharge patient to home.                       - Resume previous diet.                       - Continue present medications.                       - Await pathology results.                       - Repeat colonoscopy in 5 years if polyp adenoma and 10                        years if hyperplastic Procedure Code(s):    --- Professional ---  45385, Colonoscopy, flexible; with removal of tumor(s),                        polyp(s), or other lesion(s) by snare technique Diagnosis Code(s):    --- Professional ---                       Z12.11, Encounter for screening for malignant neoplasm                        of colon                       D12.5, Benign neoplasm of sigmoid colon                       D12.3, Benign neoplasm of transverse colon (hepatic                        flexure or splenic flexure) CPT  copyright 2016 American Medical Association. All rights reserved. The codes documented in this report are preliminary and upon coder review may  be revised to meet current compliance requirements. Lucilla Lame MD, MD 09/24/2017 11:05:56 AM This report has been signed electronically. Number of Addenda: 0 Note Initiated On: 09/24/2017 10:39 AM Scope Withdrawal Time: 0 hours 7 minutes 35 seconds  Total Procedure Duration: 0 hours 11 minutes 23 seconds       [facility]

## 2017-09-24 NOTE — Anesthesia Procedure Notes (Signed)
Procedure Name: MAC Date/Time: 09/24/2017 10:45 AM Performed by: Janna Arch Pre-anesthesia Checklist: Patient identified, Emergency Drugs available, Suction available and Patient being monitored Patient Re-evaluated:Patient Re-evaluated prior to induction Oxygen Delivery Method: Nasal cannula

## 2017-09-24 NOTE — Anesthesia Preprocedure Evaluation (Signed)
Anesthesia Evaluation  Patient identified by MRN, date of birth, ID band Patient awake    Reviewed: Allergy & Precautions, H&P , NPO status , Patient's Chart, lab work & pertinent test results  Airway Mallampati: II  TM Distance: >3 FB Neck ROM: full    Dental no notable dental hx.    Pulmonary neg pulmonary ROS,    Pulmonary exam normal        Cardiovascular hypertension, On Medications + CAD  Normal cardiovascular exam  2 Stents placed 3 years ago- stable since and no recent chest pain or shortness of breath.     Neuro/Psych    GI/Hepatic negative GI ROS, Neg liver ROS,   Endo/Other  negative endocrine ROS  Renal/GU negative Renal ROS     Musculoskeletal   Abdominal   Peds  Hematology negative hematology ROS (+)   Anesthesia Other Findings   Reproductive/Obstetrics                             Anesthesia Physical Anesthesia Plan  ASA: II  Anesthesia Plan: General   Post-op Pain Management:    Induction:   PONV Risk Score and Plan:   Airway Management Planned:   Additional Equipment:   Intra-op Plan:   Post-operative Plan:   Informed Consent: I have reviewed the patients History and Physical, chart, labs and discussed the procedure including the risks, benefits and alternatives for the proposed anesthesia with the patient or authorized representative who has indicated his/her understanding and acceptance.     Plan Discussed with:   Anesthesia Plan Comments:         Anesthesia Quick Evaluation

## 2017-09-24 NOTE — Anesthesia Postprocedure Evaluation (Signed)
Anesthesia Post Note  Patient: Sergio King  Procedure(s) Performed: Procedure(s) (LRB): COLONOSCOPY WITH PROPOFOL (N/A) POLYPECTOMY  Patient location during evaluation: PACU Anesthesia Type: General Level of consciousness: awake and alert Pain management: pain level controlled Vital Signs Assessment: post-procedure vital signs reviewed and stable Respiratory status: spontaneous breathing Cardiovascular status: blood pressure returned to baseline Postop Assessment: no headache Anesthetic complications: no    Jaci Standard, III,  Danelly Hassinger D

## 2017-09-24 NOTE — H&P (Signed)
Lucilla Lame, MD [address] [place], [place] [address] Phone: [phone] Fax : [phone]  Primary Care Physician:  Birdie Sons, MD Primary Gastroenterologist:  Dr. Allen Norris  Pre-Procedure History & Physical: HPI:  Sergio King is a 56 y.o. male is here for a screening colonoscopy.   Past Medical History:  Diagnosis Date  . Hammertoe of left foot   . Hypertension     Past Surgical History:  Procedure Laterality Date  . CARDIAC CATHETERIZATION     2 stents placed. over 3 yrs ago. [facility]  . CORONARY STENT PLACEMENT Left 10/12/2012   99% LAD and 75% first diagonal [facility]  . FOOT SURGERY Right 2007   Hammer toe surgery  . VASECTOMY  1996    Prior to Admission medications   Medication Sig Start Date End Date Taking? Authorizing Provider  aspirin EC 81 MG tablet Take 81 mg by mouth daily.   Yes [provider]  atenolol-chlorthalidone (TENORETIC) 50-25 MG tablet Take 0.5 tablets by mouth daily. 04/12/17  Yes Birdie Sons, MD  atorvastatin (LIPITOR) 40 MG tablet TAKE 1 TABLET BY MOUTH EVERY DAY 03/01/17  Yes Birdie Sons, MD  BIOTIN PO Take by mouth.   Yes [provider]  citalopram (CELEXA) 10 MG tablet Take 1 tablet (10 mg total) by mouth daily. 03/01/17  Yes Birdie Sons, MD  Coenzyme Q10 (COQ10 PO) Take by mouth.   Yes [provider]  GLUCOSAMINE-CHONDROITIN PO Take by mouth.   Yes [provider]  ketoconazole (NIZORAL) 2 % cream APPLY TOPICALLY DAILY AS NEEDED FOR IRRITATION. APPLY EXTERNALLY AS NEEDED 09/22/16  Yes Birdie Sons, MD  LORazepam (ATIVAN) 0.5 MG tablet TAKE 1 TABLET BY MOUTH EVERY NIGHT AT BEDTIME 12/31/16  Yes Birdie Sons, MD  Multiple Vitamin (MULTIVITAMIN) tablet Take 1 tablet by mouth daily.   Yes [provider]  Omega-3 Fatty Acids (FISH OIL PO) Take by mouth.   Yes [provider]    Allergies as of 08/19/2017 - Review Complete 02/12/2017  Allergen Reaction  Noted  . Penicillins  06/24/2015    Family History  Problem Relation Age of Onset  . CAD Father     Social History   Social History  . Marital status: Divorced    Spouse name: N/A  . Number of children: 2  . Years of education: N/A   Occupational History  . Employed [employer]    full time   Social History Main Topics  . Smoking status: Never Smoker  . Smokeless tobacco: Never Used  . Alcohol use 10.8 oz/week    18 Standard drinks or equivalent per week     Comment: 1 case of beer per week (18-24 beers)  . Drug use: No  . Sexual activity: Not on file   Other Topics Concern  . Not on file   Social History Narrative  . No narrative on file    Review of Systems: See HPI, otherwise negative ROS  Physical Exam: BP (!) 180/94   Pulse (!) 55   Temp 97.7 F (36.5 C) (Temporal)   Resp 16   Ht 6' (1.829 m)   Wt 223 lb (101.2 kg)   SpO2 97%   BMI 30.24 kg/m  General:   Alert,  pleasant and cooperative in NAD Head:  Normocephalic and atraumatic. Neck:  Supple; no masses or thyromegaly. Lungs:  Clear throughout to auscultation.    Heart:  Regular rate and  rhythm. Abdomen:  Soft, nontender and nondistended. Normal bowel sounds, without guarding, and without rebound.   Neurologic:  Alert and  oriented x4;  grossly normal neurologically.  Impression/Plan: Sergio King is now here to undergo a screening colonoscopy.  Risks, benefits, and alternatives regarding colonoscopy have been reviewed with the patient.  Questions have been answered.  All parties agreeable.

## 2017-09-24 NOTE — Transfer of Care (Signed)
Immediate Anesthesia Transfer of Care Note  Patient: Sergio King  Procedure(s) Performed: Procedure(s): COLONOSCOPY WITH PROPOFOL (N/A) POLYPECTOMY  Patient Location: PACU  Anesthesia Type: General  Level of Consciousness: awake, alert  and patient cooperative  Airway and Oxygen Therapy: Patient Spontanous Breathing and Patient connected to supplemental oxygen  Post-op Assessment: Post-op Vital signs reviewed, Patient's Cardiovascular Status Stable, Respiratory Function Stable, Patent Airway and No signs of Nausea or vomiting  Post-op Vital Signs: Reviewed and stable  Complications: No apparent anesthesia complications

## 2017-09-28 ENCOUNTER — Encounter: Payer: Self-pay | Admitting: Gastroenterology

## 2017-09-30 ENCOUNTER — Other Ambulatory Visit: Payer: Self-pay

## 2017-10-19 ENCOUNTER — Other Ambulatory Visit: Payer: Self-pay | Admitting: Family Medicine

## 2017-10-20 NOTE — Telephone Encounter (Signed)
Pharmacy requesting refills. Thanks!  

## 2018-01-06 ENCOUNTER — Telehealth: Payer: Self-pay | Admitting: Family Medicine

## 2018-01-06 MED ORDER — ATORVASTATIN CALCIUM 40 MG PO TABS: 40.0000 mg | ORAL_TABLET | Freq: Every day | ORAL | 2 refills | Status: DC

## 2018-01-06 NOTE — Telephone Encounter (Signed)
[facility] is [facility]ill on patient atorvastatin (LIPITOR) 40 MG tablet

## 2018-01-12 ENCOUNTER — Encounter: Payer: Self-pay | Admitting: Family Medicine

## 2018-01-12 ENCOUNTER — Ambulatory Visit (INDEPENDENT_AMBULATORY_CARE_PROVIDER_SITE_OTHER): Payer: 59 | Admitting: Family Medicine

## 2018-01-12 VITALS — BP 160/80 | HR 63 | Temp 98.5°F | Resp 16 | Wt 238.0 lb

## 2018-01-12 DIAGNOSIS — J019 Acute sinusitis, unspecified: Secondary | ICD-10-CM

## 2018-01-12 DIAGNOSIS — J40 Bronchitis, not specified as acute or chronic: Secondary | ICD-10-CM | POA: Diagnosis not present

## 2018-01-12 MED ORDER — AMOXICILLIN 500 MG PO CAPS: 1000.0000 mg | ORAL_CAPSULE | Freq: Two times a day (BID) | ORAL | 0 refills | Status: AC

## 2018-01-12 NOTE — Progress Notes (Signed)
Patient: Sergio King Male    DOB: 05/20/1961   57 y.o.   MRN: [id number] Visit Date: 01/12/2018  Today's Provider: Lelon Huh, MD   Chief Complaint  Patient presents with  . URI  . Hypertension   Subjective:    URI   This is a new problem. Episode onset: 2 weeks ago. The problem has been unchanged. There has been no fever. Associated symptoms include congestion (sinus and chest congestion), coughing (productive with green sputum), ear pain (left ear), headaches, a plugged ear sensation (worse in the left ear) and a sore throat (left side). Pertinent negatives include no abdominal pain, chest pain, nausea, vomiting or wheezing. Treatments tried: Mucinex and Flonase. The treatment provided mild relief.    Hypertension, follow-up:  BP Readings from Last 3 Encounters:  09/24/17 134/81  08/23/17 138/80  02/12/17 138/82    He was last seen for hypertension 11 months ago.  BP at that visit was 138/82. Management since that visit includes no changes. He reports good compliance with treatment. He is not having side effects.  He reports his blood pressure has been running high in the past 2 week.  He is exercising 2 times a week at the gym. He is adherent to low salt diet.   Outside blood pressures are 165/80. He is experiencing none.  Patient denies chest pain, chest pressure/discomfort, claudication, dyspnea, exertional chest pressure/discomfort, fatigue, irregular heart beat, lower extremity edema, near-syncope, orthopnea, palpitations, paroxysmal nocturnal dyspnea, syncope and tachypnea.   Cardiovascular risk factors include hypertension and male gender.  Use of agents associated with hypertension: NSAIDS.     Weight trend: increasing steadily Wt Readings from Last 3 Encounters:  09/24/17 223 lb (101.2 kg)  08/23/17 228 lb (103.4 kg)  02/12/17 223 lb (101.2 kg)    Current diet: in general, an "unhealthy"  diet  ------------------------------------------------------------------------     No Active Allergies   Current Outpatient Medications:  .  aspirin EC 81 MG tablet, Take 81 mg by mouth daily., Disp: , Rfl:  .  atenolol-chlorthalidone (TENORETIC) 50-25 MG tablet, Take 0.5 tablets by mouth daily., Disp: 45 tablet, Rfl: 3 .  atorvastatin (LIPITOR) 40 MG tablet, Take 1 tablet (40 mg total) by mouth daily., Disp: 30 tablet, Rfl: 2 .  BIOTIN PO, Take by mouth., Disp: , Rfl:  .  citalopram (CELEXA) 10 MG tablet, Take 1 tablet (10 mg total) by mouth daily., Disp: 30 tablet, Rfl: 11 .  Coenzyme Q10 (COQ10 PO), Take by mouth., Disp: , Rfl:  .  GLUCOSAMINE-CHONDROITIN PO, Take by mouth., Disp: , Rfl:  .  ketoconazole (NIZORAL) 2 % cream, APPLY TOPICALLY DAILY AS NEEDED FOR IRRITATION. APPLY EXTERNALLY AS NEEDED, Disp: 30 g, Rfl: 3 .  Multiple Vitamin (MULTIVITAMIN) tablet, Take 1 tablet by mouth daily., Disp: , Rfl:  .  Omega-3 Fatty Acids (FISH OIL PO), Take by mouth., Disp: , Rfl:  .  fluconazole (DIFLUCAN) 150 MG tablet, TAKE 1 TABLET BY MOUTH WEEKLY AS NEEDED (Patient not taking: Reported on 01/12/2018), Disp: 4 tablet, Rfl: 3 .  LORazepam (ATIVAN) 0.5 MG tablet, TAKE 1 TABLET BY MOUTH EVERY NIGHT AT BEDTIME (Patient not taking: Reported on 01/12/2018), Disp: 30 tablet, Rfl: 5  Review of Systems  Constitutional: Negative for appetite change, chills and fever.  HENT: Positive for congestion (sinus and chest congestion), ear pain (left ear) and sore throat (left side).   Respiratory: Positive for cough (productive with green sputum). Negative for chest  tightness, shortness of breath and wheezing.   Cardiovascular: Negative for chest pain and palpitations.  Gastrointestinal: Negative for abdominal pain, nausea and vomiting.  Neurological: Positive for headaches.    Social History   Tobacco Use  . Smoking status: Never Smoker  . Smokeless tobacco: Never Used  Substance Use Topics  . Alcohol  use: Yes    Alcohol/week: 10.8 oz    Types: 18 Standard drinks or equivalent per week    Comment: 1 case of beer per week (18-24 beers)   Objective:   BP (!) 160/80 (BP Location: Left Arm, Patient Position: Sitting, Cuff Size: Large)   Pulse 63   Temp 98.5 F (36.9 C) (Oral)   Resp 16   Wt 238 lb (108 kg)   SpO2 97% Comment: room air  BMI 32.28 kg/m  There were no vitals filed for this visit.   Physical Exam  General Appearance:    Alert, cooperative, no distress  HENT:   bilateral TM normal without fluid or infection, neck without nodes, sinuses tender and nasal mucosa pale and congested  Eyes:    PERRL, conjunctiva/corneas clear, EOM's intact       Lungs:     Clear to auscultation bilaterally, respirations unlabored  Heart:    Regular rate and rhythm  Neurologic:   Awake, alert, oriented x 3. No apparent focal neurological           defect.           Assessment & Plan:      1. Acute sinusitis, recurrence not specified, unspecified location  - amoxicillin (AMOXIL) 500 MG capsule; Take 2 capsules (1,000 mg total) by mouth 2 (two) times daily for 10 days.  Dispense: 40 capsule; Refill: 0  2. Bronchitis  - amoxicillin (AMOXIL) 500 MG capsule; Take 2 capsules (1,000 mg total) by mouth 2 (two) times daily for 10 days.  Dispense: 40 capsule; Refill: 0  Call if symptoms change or if not rapidly improving.           Lelon Huh, MD  [facility]

## 2018-02-14 ENCOUNTER — Other Ambulatory Visit: Payer: Self-pay | Admitting: Family Medicine

## 2018-02-14 MED ORDER — CITALOPRAM HYDROBROMIDE 10 MG PO TABS: 10.0000 mg | ORAL_TABLET | Freq: Every day | ORAL | 3 refills | Status: DC

## 2018-02-14 NOTE — Telephone Encounter (Signed)
[facility] 90[facility]days supply for the following medication. Thanks CC  citalopram (CELEXA) 10 MG tablet

## 2018-02-14 NOTE — Telephone Encounter (Signed)
Requesting 90 day supply.

## 2018-02-21 ENCOUNTER — Encounter: Payer: Self-pay | Admitting: Family Medicine

## 2018-02-28 ENCOUNTER — Encounter: Payer: Self-pay | Admitting: Family Medicine

## 2018-03-04 ENCOUNTER — Ambulatory Visit (INDEPENDENT_AMBULATORY_CARE_PROVIDER_SITE_OTHER): Payer: 59 | Admitting: Family Medicine

## 2018-03-04 ENCOUNTER — Encounter: Payer: Self-pay | Admitting: Family Medicine

## 2018-03-04 VITALS — BP 154/80 | HR 49 | Temp 97.8°F | Resp 16 | Ht 72.0 in | Wt 235.0 lb

## 2018-03-04 DIAGNOSIS — I10 Essential (primary) hypertension: Secondary | ICD-10-CM

## 2018-03-04 DIAGNOSIS — E781 Pure hyperglyceridemia: Secondary | ICD-10-CM

## 2018-03-04 DIAGNOSIS — N529 Male erectile dysfunction, unspecified: Secondary | ICD-10-CM | POA: Diagnosis not present

## 2018-03-04 DIAGNOSIS — Z23 Encounter for immunization: Secondary | ICD-10-CM | POA: Diagnosis not present

## 2018-03-04 DIAGNOSIS — I2510 Atherosclerotic heart disease of native coronary artery without angina pectoris: Secondary | ICD-10-CM | POA: Diagnosis not present

## 2018-03-04 DIAGNOSIS — Z0000 Encounter for general adult medical examination without abnormal findings: Secondary | ICD-10-CM | POA: Diagnosis not present

## 2018-03-04 DIAGNOSIS — R739 Hyperglycemia, unspecified: Secondary | ICD-10-CM | POA: Diagnosis not present

## 2018-03-04 DIAGNOSIS — Z125 Encounter for screening for malignant neoplasm of prostate: Secondary | ICD-10-CM

## 2018-03-04 MED ORDER — SILDENAFIL CITRATE 50 MG PO TABS
50.0000 mg | ORAL_TABLET | Freq: Every day | ORAL | 3 refills | Status: DC | PRN
Start: 2018-03-04 — End: 2019-02-14

## 2018-03-04 NOTE — Progress Notes (Signed)
Patient: Sergio King, Male    DOB: 11/13/1961, 57 y.o.   MRN: [id number] Visit Date: 03/04/2018  Today's Provider: Lelon Huh, MD   Chief Complaint  Patient presents with  . Annual Exam  . Hypertension  . Coronary Artery Disease  . Hyperglycemia  . Hyperlipidemia   Subjective:    Annual physical exam Sergio King is a 57 y.o. male who presents today for health maintenance and complete physical. He feels fairly well. He reports exercising 3-4 days week. He reports he is sleeping fairly well.  -----------------------------------------------------------------  Hypertension, follow-up:  BP Readings from Last 3 Encounters:  01/12/18 (!) 160/80  09/24/17 134/81  08/23/17 138/80    He was last seen for hypertension 1 years ago.  BP at that visit was 138/82. Management since that visit includes no changes. He reports good compliance with treatment. He is not having side effects.  He is exercising. He is adherent to low salt diet.   Outside blood pressures are 140's/70's. He is experiencing none.  Patient denies chest pain, chest pressure/discomfort, claudication, dyspnea, exertional chest pressure/discomfort, fatigue, irregular heart beat, lower extremity edema, near-syncope, orthopnea, palpitations, paroxysmal nocturnal dyspnea, syncope and tachypnea.   Cardiovascular risk factors include hypertension and male gender.  Use of agents associated with hypertension: NSAIDS.     Weight trend: fluctuating a bit Wt Readings from Last 3 Encounters:  01/12/18 238 lb (108 kg)  09/24/17 223 lb (101.2 kg)  08/23/17 228 lb (103.4 kg)    Current diet: well balanced  ------------------------------------------------------------------------ Follow up CAD: Patient was last seen for this problem 1 year ago and no changes were made. Patient was advised to continue to follow up with Dr. Clayborn Bigness as scheduled.   Follow up of Hyperglycemia: Patient was last seen for  this problem 1 year ago and no changes were made.    Lab Results  Component Value Date   HGBA1C 5.4 02/12/2017      Lipid/Cholesterol, Follow-up:   Last seen for this1 years ago.  Management changes since that visit include none. . Last Lipid Panel:    Component Value Date/Time   CHOL 116 02/12/2017 1554   TRIG 90 02/12/2017 1554   HDL 43 02/12/2017 1554   CHOLHDL 2.7 02/12/2017 1554   LDLCALC 55 02/12/2017 1554    Risk factors for vascular disease include hypercholesterolemia and hypertension  He reports good compliance with treatment. He is not having side effects.  Current symptoms include none and have been stable. Weight trend: fluctuating a bit Prior visit with dietician: no Current diet: well balanced Current exercise: cardiovascular workout on exercise equipment  Wt Readings from Last 3 Encounters:  01/12/18 238 lb (108 kg)  09/24/17 223 lb (101.2 kg)  08/23/17 228 lb (103.4 kg)    -------------------------------------------------------------------  He also reports he is having difficulty maintaining erection for intercourse and concerned this may be effect of his blood pressure medications. Is interested in trying medication to help with symptoms.    Review of Systems  Constitutional: Negative for appetite change, chills, fatigue and fever.  HENT: Positive for sore throat. Negative for congestion, ear pain, hearing loss, nosebleeds and trouble swallowing.   Eyes: Negative for pain and visual disturbance.  Respiratory: Positive for cough. Negative for chest tightness and shortness of breath.   Cardiovascular: Negative for chest pain, palpitations and leg swelling.  Gastrointestinal: Negative for abdominal pain, blood in stool, constipation, diarrhea, nausea and vomiting.  Endocrine: Negative for polydipsia,  polyphagia and polyuria.  Genitourinary: Negative for dysuria and flank pain.  Musculoskeletal: Negative for arthralgias, back pain, joint swelling,  myalgias and neck stiffness.  Skin: Positive for rash. Negative for color change and wound.  Neurological: Negative for dizziness, tremors, seizures, speech difficulty, weakness, light-headedness and headaches.  Psychiatric/Behavioral: Negative for behavioral problems, confusion, decreased concentration, dysphoric mood and sleep disturbance. The patient is not nervous/anxious.   All other systems reviewed and are negative.   Social History      He  reports that  has never smoked. he has never used smokeless tobacco. He reports that he drinks about 10.8 oz of alcohol per week. He reports that he does not use drugs.       Social History   Socioeconomic History  . Marital status: Divorced    Spouse name: Not on file  . Number of children: 2  . Years of education: Not on file  . Highest education level: Not on file  Social Needs  . Financial resource strain: Not on file  . Food insecurity - worry: Not on file  . Food insecurity - inability: Not on file  . Transportation needs - medical: Not on file  . Transportation needs - non-medical: Not on file  Occupational History  . Occupation: Employed    Employer: [employer]    Comment: full time  Tobacco Use  . Smoking status: Never Smoker  . Smokeless tobacco: Never Used  Substance and Sexual Activity  . Alcohol use: Yes    Alcohol/week: 10.8 oz    Types: 18 Standard drinks or equivalent per week    Comment: 1 case of beer per week (18-24 beers)  . Drug use: No  . Sexual activity: Not on file  Other Topics Concern  . Not on file  Social History Narrative  . Not on file    Past Medical History:  Diagnosis Date  . Hammertoe of left foot   . Hypertension      Patient Active Problem List   Diagnosis Date Noted  . Screening for colorectal cancer   . Polyp of sigmoid colon   . Benign neoplasm of transverse colon   . CAD (coronary artery disease) 06/24/2015  . Hyperglycemia 06/24/2015  . Adult situational stress disorder  06/18/2015  . Hypersomnia 08/08/2009  . Fam hx-ischem heart disease 09/13/2007  . Hyperglyceridemia, pure 12/16/2005  . Essential (primary) hypertension 12/28/2004    Past Surgical History:  Procedure Laterality Date  . CARDIAC CATHETERIZATION     2 stents placed. over 3 yrs ago. [facility]  . COLONOSCOPY WITH PROPOFOL N/A 09/24/2017   Procedure: COLONOSCOPY WITH PROPOFOL;  Surgeon: Lucilla Lame, MD;  Location: [facility];  Service: Gastroenterology;  Laterality: N/A;  . CORONARY STENT PLACEMENT Left 10/12/2012   99% LAD and 75% first diagonal [facility]  . FOOT SURGERY Right 2007   Hammer toe surgery  . POLYPECTOMY  09/24/2017   Procedure: POLYPECTOMY;  Surgeon: Lucilla Lame, MD;  Location: [facility];  Service: Gastroenterology;;  . [place]    Family History        Family Status  Relation Name Status  . Mother  Alive       DDD, Gallbladder removed  . Father  Alive  . Brother  Alive  . Brother  Alive  . Brother  Alive        His family history includes CAD in his father.      No Known Allergies   Current  Outpatient Medications:  .  aspirin EC 81 MG tablet, Take 81 mg by mouth daily., Disp: , Rfl:  .  atenolol-chlorthalidone (TENORETIC) 50-25 MG tablet, Take 0.5 tablets by mouth daily., Disp: 45 tablet, Rfl: 3 .  atorvastatin (LIPITOR) 40 MG tablet, Take 1 tablet (40 mg total) by mouth daily., Disp: 30 tablet, Rfl: 2 .  BIOTIN PO, Take by mouth., Disp: , Rfl:  .  citalopram (CELEXA) 10 MG tablet, Take 1 tablet (10 mg total) by mouth daily., Disp: 90 tablet, Rfl: 3 .  Coenzyme Q10 (COQ10 PO), Take by mouth., Disp: , Rfl:  .  fluconazole (DIFLUCAN) 150 MG tablet, TAKE 1 TABLET BY MOUTH WEEKLY AS NEEDED, Disp: 4 tablet, Rfl: 3 .  GLUCOSAMINE-CHONDROITIN PO, Take by mouth., Disp: , Rfl:  .  ketoconazole (NIZORAL) 2 % cream, APPLY TOPICALLY DAILY AS NEEDED FOR IRRITATION. APPLY EXTERNALLY AS NEEDED, Disp: 30 g, Rfl: 3 .  LORazepam (ATIVAN) 0.5 MG tablet, TAKE 1  TABLET BY MOUTH EVERY NIGHT AT BEDTIME, Disp: 30 tablet, Rfl: 5 .  Multiple Vitamin (MULTIVITAMIN) tablet, Take 1 tablet by mouth daily., Disp: , Rfl:  .  Omega-3 Fatty Acids (FISH OIL PO), Take by mouth., Disp: , Rfl:    Patient Care Team: Birdie Sons, MD as PCP - General (Family Medicine) Yolonda Kida, MD as Consulting Physician (Cardiology)      Objective:   Vitals: BP (!) 154/80 (BP Location: Right Arm, Cuff Size: Large)   Pulse (!) 49   Temp 97.8 F (36.6 C) (Oral)   Resp 16   Ht 6' (1.829 m)   Wt 235 lb (106.6 kg)   SpO2 97% Comment: room air  BMI 31.87 kg/m    Vitals:   03/04/18 0915 03/04/18 0918  BP: (!) 148/80 (!) 154/80  Pulse: (!) 49   Resp: 16   Temp: 97.8 F (36.6 C)   TempSrc: Oral   SpO2: 97%   Weight: 235 lb (106.6 kg)   Height: 6' (1.829 m)      Physical Exam   General Appearance:    Alert, cooperative, no distress, appears stated age  Head:    Normocephalic, without obvious abnormality, atraumatic  Eyes:    PERRL, conjunctiva/corneas clear, EOM's intact, fundi    benign, both eyes       Ears:    Normal TM's and external ear canals, both ears  Nose:   Nares normal, septum midline, mucosa normal, no drainage   or sinus tenderness  Throat:   Lips, mucosa, and tongue normal; teeth and gums normal  Neck:   Supple, symmetrical, trachea midline, no adenopathy;       thyroid:  No enlargement/tenderness/nodules; no carotid   bruit or JVD  Back:     Symmetric, no curvature, ROM normal, no CVA tenderness  Lungs:     Clear to auscultation bilaterally, respirations unlabored  Chest wall:    No tenderness or deformity  Heart:    Regular rate and rhythm, S1 and S2 normal, no murmur, rub   or gallop  Abdomen:     Soft, non-tender, bowel sounds active all four quadrants,    no masses, no organomegaly  Genitalia:    deferred  Rectal:    deferred  Extremities:   Extremities normal, atraumatic, no cyanosis or edema  Pulses:   2+ and symmetric all  extremities  Skin:   Skin color, texture, turgor normal, no rashes or lesions  Lymph nodes:   Cervical, supraclavicular, and  axillary nodes normal  Neurologic:   CNII-XII intact. Normal strength, sensation and reflexes      throughout    Depression Screen PHQ 2/9 Scores 03/04/2018 08/23/2017 06/24/2015  PHQ - 2 Score 0 0 0  PHQ- 9 Score 0 0 2      Assessment & Plan:     Routine Health Maintenance and Physical Exam  Exercise Activities and Dietary recommendations Goals    None      Immunization History  Administered Date(s) Administered  . Influenza-Unspecified 10/29/2017  . Tdap 09/28/2007    Health Maintenance  Topic Date Due  . TETANUS/TDAP  09/27/2017  . COLONOSCOPY  09/24/2022  . INFLUENZA VACCINE  Completed  . Hepatitis C Screening  Completed  . HIV Screening  Completed     Discussed health benefits of physical activity, and encouraged him to engage in regular exercise appropriate for his age and condition.    --------------------------------------------------------------------  1. Annual physical exam  - Comprehensive metabolic panel - Lipid panel - PSA - Hemoglobin A1c - TSH  2. Coronary artery disease involving native coronary artery of native heart without angina pectoris Asymptomatic. Compliant with medication.  Continue aggressive risk factor modification.  Continue routine follow up Dr. Clayborn Bigness.  - Comprehensive metabolic panel  3. Essential (primary) hypertension Well controlled.  Continue current medications.   - TSH  4. Hyperglycemia  - Hemoglobin A1c  5. Hyperglyceridemia, pure He is tolerating atorvastatin well with no adverse effects.   - Comprehensive metabolic panel - Lipid panel  6. Prostate cancer screening  - PSA  7. Need for prophylactic vaccination using tetanus and diphtheria toxoids adsorbed (Td) vaccine  - Td : Tetanus/diphtheria >7yo Preservative  free  8. Erectile dysfunction, unspecified erectile dysfunction  type Counseled regarding importance to continue current medications for cardiac health, and will add sildenafil prn.    Lelon Huh, MD  [facility]

## 2018-03-05 LAB — COMPREHENSIVE METABOLIC PANEL
A/G RATIO: 1.9 (ref 1.2–2.2)
ALBUMIN: 4.7 g/dL (ref 3.5–5.5)
ALT: 50 IU/L — ABNORMAL HIGH (ref 0–44)
AST: 32 IU/L (ref 0–40)
Alkaline Phosphatase: 77 IU/L (ref 39–117)
BUN / CREAT RATIO: 19 (ref 9–20)
BUN: 18 mg/dL (ref 6–24)
Bilirubin Total: 0.8 mg/dL (ref 0.0–1.2)
CALCIUM: 9.9 mg/dL (ref 8.7–10.2)
CO2: 25 mmol/L (ref 20–29)
Chloride: 98 mmol/L (ref 96–106)
Creatinine, Ser: 0.94 mg/dL (ref 0.76–1.27)
GFR calc non Af Amer: 90 mL/min/{1.73_m2} (ref >59)
GFR, EST AFRICAN AMERICAN: 104 mL/min/{1.73_m2} (ref >59)
GLOBULIN, TOTAL: 2.5 g/dL (ref 1.5–4.5)
Glucose: 109 mg/dL — ABNORMAL HIGH (ref 65–99)
POTASSIUM: 4 mmol/L (ref 3.5–5.2)
SODIUM: 139 mmol/L (ref 134–144)
TOTAL PROTEIN: 7.2 g/dL (ref 6.0–8.5)

## 2018-03-05 LAB — LIPID PANEL
CHOL/HDL RATIO: 3.6 ratio (ref 0.0–5.0)
Cholesterol, Total: 152 mg/dL (ref 100–199)
HDL: 42 mg/dL (ref >39)
LDL CALC: 79 mg/dL (ref 0–99)
Triglycerides: 156 mg/dL — ABNORMAL HIGH (ref 0–149)
VLDL Cholesterol Cal: 31 mg/dL (ref 5–40)

## 2018-03-05 LAB — HEMOGLOBIN A1C
ESTIMATED AVERAGE GLUCOSE: 108 mg/dL
HEMOGLOBIN A1C: 5.4 % (ref 4.8–5.6)

## 2018-03-05 LAB — TSH: TSH: 1.02 u[IU]/mL (ref 0.450–4.500)

## 2018-03-05 LAB — PSA: PROSTATE SPECIFIC AG, SERUM: 1.2 ng/mL (ref 0.0–4.0)

## 2018-03-07 ENCOUNTER — Telehealth: Payer: Self-pay

## 2018-03-07 MED ORDER — LISINOPRIL 5 MG PO TABS: 5.0000 mg | ORAL_TABLET | Freq: Every day | ORAL | 3 refills | Status: DC

## 2018-03-07 NOTE — Telephone Encounter (Signed)
-----   Message from Birdie Sons, MD sent at 03/07/2018  8:22 AM EDT ----- PSA, blood sugar, kidney functions, electrolytes and cholesterol are all normal. Check labs yearly.

## 2018-03-07 NOTE — Telephone Encounter (Signed)
Pres[facility] for lisinopril [facility] [place]. This is to be taken IN ADDITION to anteolol-hctz. Need to follow up for BP check in 2-3 months.

## 2018-03-07 NOTE — Telephone Encounter (Signed)
Patient was advised. Expressed understanding. Scheduled 2 month f/u appt.

## 2018-03-07 NOTE — Telephone Encounter (Signed)
Pt advised.  He reported you were thinking about changing his blood pressure medication after you got he blood work back.    Thanks,   -Mickel Baas

## 2018-03-30 ENCOUNTER — Other Ambulatory Visit: Payer: Self-pay | Admitting: Family Medicine

## 2018-03-30 DIAGNOSIS — J029 Acute pharyngitis, unspecified: Secondary | ICD-10-CM | POA: Diagnosis not present

## 2018-03-30 DIAGNOSIS — J069 Acute upper respiratory infection, unspecified: Secondary | ICD-10-CM | POA: Diagnosis not present

## 2018-03-30 DIAGNOSIS — R05 Cough: Secondary | ICD-10-CM | POA: Diagnosis not present

## 2018-03-31 ENCOUNTER — Other Ambulatory Visit: Payer: Self-pay | Admitting: Family Medicine

## 2018-04-25 DIAGNOSIS — J019 Acute sinusitis, unspecified: Secondary | ICD-10-CM | POA: Diagnosis not present

## 2018-04-25 DIAGNOSIS — R05 Cough: Secondary | ICD-10-CM | POA: Diagnosis not present

## 2018-05-10 ENCOUNTER — Encounter: Payer: Self-pay | Admitting: Family Medicine

## 2018-05-10 ENCOUNTER — Ambulatory Visit (INDEPENDENT_AMBULATORY_CARE_PROVIDER_SITE_OTHER): Payer: 59 | Admitting: Family Medicine

## 2018-05-10 VITALS — BP 136/80 | HR 58 | Temp 98.0°F | Resp 16 | Wt 231.0 lb

## 2018-05-10 DIAGNOSIS — I10 Essential (primary) hypertension: Secondary | ICD-10-CM

## 2018-05-10 NOTE — Progress Notes (Signed)
Patient: Sergio King Male    DOB: 1961/11/12   57 y.o.   MRN: [id number] Visit Date: 05/10/2018  Today's Provider: Lelon Huh, MD   Chief Complaint  Patient presents with  . Follow-up  . Hypertension   Subjective:    HPI   Hypertension, follow-up:  BP Readings from Last 3 Encounters:  05/10/18 136/80  03/04/18 (!) 154/80  01/12/18 (!) 160/80    He was last seen for hypertension 2 months ago.  BP at that visit was 154/80. Management since that visit includes; added lisinopril 5 mg qd. Advised to follow-up for Bp check in 2-3 months.He reports good compliance with treatment. He is not having side effects.  He is exercising. He is adherent to low salt diet.   Outside blood pressures are not checking. He is experiencing none.  Patient denies none.   Cardiovascular risk factors include advanced age (older than 32 for men, 8 for women).  Use of agents associated with hypertension: none ----------------------------------------------------------------    No Known Allergies   Current Outpatient Medications:  .  aspirin EC 81 MG tablet, Take 81 mg by mouth daily., Disp: , Rfl:  .  atenolol-chlorthalidone (TENORETIC) 50-25 MG tablet, TAKE 0.5 TABLETS BY MOUTH DAILY., Disp: 45 tablet, Rfl: 4 .  atorvastatin (LIPITOR) 40 MG tablet, TAKE 1 TABLET BY MOUTH EVERY DAY, Disp: 30 tablet, Rfl: 6 .  BIOTIN PO, Take by mouth., Disp: , Rfl:  .  citalopram (CELEXA) 10 MG tablet, Take 1 tablet (10 mg total) by mouth daily., Disp: 90 tablet, Rfl: 3 .  Coenzyme Q10 (COQ10 PO), Take by mouth., Disp: , Rfl:  .  fluconazole (DIFLUCAN) 150 MG tablet, TAKE 1 TABLET BY MOUTH WEEKLY AS NEEDED, Disp: 4 tablet, Rfl: 3 .  GLUCOSAMINE-CHONDROITIN PO, Take by mouth., Disp: , Rfl:  .  ketoconazole (NIZORAL) 2 % cream, APPLY TOPICALLY DAILY AS NEEDED FOR IRRITATION. APPLY EXTERNALLY AS NEEDED, Disp: 30 g, Rfl: 3 .  lisinopril (PRINIVIL,ZESTRIL) 5 MG tablet, Take 1 tablet (5 mg total) by  mouth daily., Disp: 30 tablet, Rfl: 3 .  LORazepam (ATIVAN) 0.5 MG tablet, TAKE 1 TABLET BY MOUTH EVERY NIGHT AT BEDTIME, Disp: 30 tablet, Rfl: 5 .  Multiple Vitamin (MULTIVITAMIN) tablet, Take 1 tablet by mouth daily., Disp: , Rfl:  .  Omega-3 Fatty Acids (FISH OIL PO), Take by mouth., Disp: , Rfl:  .  sildenafil (VIAGRA) 50 MG tablet, Take 1 tablet (50 mg total) by mouth daily as needed for erectile dysfunction., Disp: 8 tablet, Rfl: 3  Review of Systems  Constitutional: Negative for appetite change, chills and fever.  Respiratory: Negative for chest tightness, shortness of breath and wheezing.   Cardiovascular: Negative for chest pain and palpitations.  Gastrointestinal: Negative for abdominal pain, nausea and vomiting.    Social History   Tobacco Use  . Smoking status: Never Smoker  . Smokeless tobacco: Never Used  Substance Use Topics  . Alcohol use: Yes    Alcohol/week: 10.8 oz    Types: 18 Standard drinks or equivalent per week    Comment: 1 case of beer per week (18-24 beers)   Objective:   BP 136/80 (BP Location: Right Arm, Patient Position: Sitting, Cuff Size: Large)   Pulse (!) 58   Temp 98 F (36.7 C) (Oral)   Resp 16   Wt 231 lb (104.8 kg)   SpO2 96%   BMI 31.33 kg/m     Physical Exam  General  appearance: alert, well developed, well nourished, cooperative and in no distress Head: Normocephalic, without obvious abnormality, atraumatic Respiratory: Respirations even and unlabored, normal respiratory rate Extremities: No gross deformities      Assessment & Plan:     1. Essential (primary) hypertension Much better with the addition of lisinopril which he is tolerating well. Will refill for 90 days if labs are normal.  - Renal function panel       Lelon Huh, MD  [facility]

## 2018-05-11 ENCOUNTER — Other Ambulatory Visit: Payer: Self-pay | Admitting: Family Medicine

## 2018-05-11 LAB — RENAL FUNCTION PANEL
Albumin: 4.5 g/dL (ref 3.5–5.5)
BUN / CREAT RATIO: 26 — AB (ref 9–20)
BUN: 24 mg/dL (ref 6–24)
CO2: 23 mmol/L (ref 20–29)
Calcium: 9.5 mg/dL (ref 8.7–10.2)
Chloride: 99 mmol/L (ref 96–106)
Creatinine, Ser: 0.92 mg/dL (ref 0.76–1.27)
GFR, EST AFRICAN AMERICAN: 107 mL/min/{1.73_m2} (ref >59)
GFR, EST NON AFRICAN AMERICAN: 93 mL/min/{1.73_m2} (ref >59)
GLUCOSE: 97 mg/dL (ref 65–99)
POTASSIUM: 3.8 mmol/L (ref 3.5–5.2)
Phosphorus: 3.4 mg/dL (ref 2.5–4.5)
Sodium: 139 mmol/L (ref 134–144)

## 2018-05-11 MED ORDER — LISINOPRIL 5 MG PO TABS: 5.0000 mg | ORAL_TABLET | Freq: Every day | ORAL | 4 refills | Status: DC

## 2018-06-07 ENCOUNTER — Other Ambulatory Visit: Payer: Self-pay | Admitting: Family Medicine

## 2018-08-30 DIAGNOSIS — M1990 Unspecified osteoarthritis, unspecified site: Secondary | ICD-10-CM | POA: Diagnosis not present

## 2018-08-30 DIAGNOSIS — I10 Essential (primary) hypertension: Secondary | ICD-10-CM | POA: Diagnosis not present

## 2018-08-30 DIAGNOSIS — E782 Mixed hyperlipidemia: Secondary | ICD-10-CM | POA: Diagnosis not present

## 2018-11-04 ENCOUNTER — Ambulatory Visit (INDEPENDENT_AMBULATORY_CARE_PROVIDER_SITE_OTHER): Payer: 59 | Admitting: Family Medicine

## 2018-11-04 ENCOUNTER — Encounter: Payer: Self-pay | Admitting: Family Medicine

## 2018-11-04 VITALS — BP 146/82 | HR 52 | Temp 97.8°F | Resp 16 | Wt 234.0 lb

## 2018-11-04 DIAGNOSIS — I10 Essential (primary) hypertension: Secondary | ICD-10-CM

## 2018-11-04 MED ORDER — AMLODIPINE BESYLATE 5 MG PO TABS: 5.0000 mg | ORAL_TABLET | Freq: Every day | ORAL | 0 refills | Status: DC

## 2018-11-04 NOTE — Progress Notes (Signed)
Patient: Sergio King Male    DOB: 11/17/1961   57 y.o.   MRN: [id number] Visit Date: 11/04/2018  Today's Provider: Lelon Huh, MD   Chief Complaint  Patient presents with  . 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  He states is blood pressure was elevated at 185/93 and 197/86.  He also took his blood pressure at home after the physical and it was 165/70.    Hypertension  This is a chronic problem. The problem has been gradually worsening since onset. The problem is uncontrolled. Associated symptoms include headaches. Pertinent negatives include no anxiety, blurred vision, chest pain, malaise/fatigue, neck pain, orthopnea, palpitations, peripheral edema, PND, shortness of breath or sweats. There are no associated agents to hypertension. There are no compliance problems.   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 Also admits to taking ibuprofen for knee pain fairly regularly. Does not add salt to foods     No Known Allergies   Current Outpatient Medications:  .  aspirin EC 81 MG tablet, Take 81 mg by mouth daily., Disp: , Rfl:  .  atenolol-chlorthalidone (TENORETIC) 50-25 MG tablet, TAKE 0.5 TABLETS BY MOUTH DAILY., Disp: 45 tablet, Rfl: 4 .  atorvastatin (LIPITOR) 40 MG tablet, TAKE 1 TABLET BY MOUTH EVERY DAY, Disp: 30 tablet, Rfl: 6 .  BIOTIN PO, Take by mouth., Disp: , Rfl:  .  citalopram (CELEXA) 10 MG tablet, Take 1 tablet (10 mg total) by mouth daily., Disp: 90 tablet, Rfl: 3 .  Coenzyme Q10 (COQ10 PO), Take by mouth., Disp: , Rfl:  .  fluconazole (DIFLUCAN) 150 MG tablet, TAKE 1 TABLET BY MOUTH WEEKLY AS NEEDED, Disp: 4 tablet, Rfl: 3 .  GLUCOSAMINE-CHONDROITIN PO, Take by mouth., Disp: , Rfl:  .  ketoconazole (NIZORAL) 2 % cream, APPLY TOPICALLY DAILY AS NEEDED FOR IRRITATION. APPLY EXTERNALLY AS NEEDED, Disp: 30 g, Rfl: 3 .  lisinopril  (PRINIVIL,ZESTRIL) 5 MG tablet, Take 1 tablet (5 mg total) by mouth daily., Disp: 90 tablet, Rfl: 4 .  Multiple Vitamin (MULTIVITAMIN) tablet, Take 1 tablet by mouth daily., Disp: , Rfl:  .  Omega-3 Fatty Acids (FISH OIL PO), Take by mouth., Disp: , Rfl:  .  sildenafil (VIAGRA) 50 MG tablet, Take 1 tablet (50 mg total) by mouth daily as needed for erectile dysfunction., Disp: 8 tablet, Rfl: 3 .  LORazepam (ATIVAN) 0.5 MG tablet, TAKE 1 TABLET BY MOUTH EVERY NIGHT AT BEDTIME (Patient not taking: Reported on 11/04/2018), Disp: 30 tablet, Rfl: 5  Review of Systems  Constitutional: Negative for activity change, appetite change, chills, diaphoresis, fatigue, fever, malaise/fatigue and unexpected weight change.  Eyes: Negative for blurred vision.  Respiratory: Negative.  Negative for shortness of breath.   Cardiovascular: Negative.  Negative for chest pain, palpitations, orthopnea and PND.  Gastrointestinal: Negative.   Musculoskeletal: Negative.  Negative for neck pain.  Neurological: Positive for headaches. Negative for dizziness and light-headedness.    Social History   Tobacco Use  . Smoking status: Never Smoker  . Smokeless tobacco: Never Used  Substance Use Topics  . Alcohol use: Yes    Alcohol/week: 18.0 standard drinks    Types: 18 Standard drinks or equivalent per week    Comment: 1 case of beer per week (18-24 beers)   Objective:    Vitals:   11/04/18 0830 11/04/18 0850  BP: (!) 164/88 (!) 146/82  Pulse: (!) 52   Resp: 16   Temp: 97.8 F (36.6 C)   TempSrc: Oral   Weight: 234 lb (106.1 kg)      Physical Exam   General Appearance:    Alert, cooperative, no distress  Eyes:    PERRL, conjunctiva/corneas clear, EOM's intact       Lungs:     Clear to auscultation bilaterally, respirations unlabored  Heart:    Regular rate and rhythm  Neurologic:   Awake, alert, oriented x 3. No apparent focal neurological           defect.           Assessment & Plan:     1.  Essential (primary) hypertension Uncontrolled. Add amLODipine (NORVASC) 5 MG tablet; Take 1 tablet (5 mg total) by mouth daily.  Dispense: 30 tablet; Refill: 0  Return in 1 week for BP check.       Lelon Huh, MD  [facility]

## 2018-11-11 ENCOUNTER — Ambulatory Visit (INDEPENDENT_AMBULATORY_CARE_PROVIDER_SITE_OTHER): Payer: 59 | Admitting: Family Medicine

## 2018-11-11 ENCOUNTER — Telehealth: Payer: Self-pay

## 2018-11-11 ENCOUNTER — Encounter: Payer: Self-pay | Admitting: Family Medicine

## 2018-11-11 VITALS — BP 142/72 | HR 54 | Temp 97.7°F | Resp 18 | Ht 72.0 in | Wt 235.0 lb

## 2018-11-11 DIAGNOSIS — I10 Essential (primary) hypertension: Secondary | ICD-10-CM

## 2018-11-11 MED ORDER — BENAZEPRIL HCL 20 MG PO TABS: ORAL_TABLET | ORAL | 0 refills | Status: DC

## 2018-11-11 NOTE — Telephone Encounter (Signed)
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 He has scheduled a follow up appt next week to have BP rechecked. Thanks!

## 2018-11-11 NOTE — Patient Instructions (Addendum)
   Stop taking lisinopril    Start taking 1/2 tablet of benazepril a day for two days then increase to once full tablet daily   Continue atenolol and amlodipine

## 2018-11-11 NOTE — Progress Notes (Signed)
Patient: Sergio King Male    DOB: October 08, 1961   57 y.o.   MRN: [id number] Visit Date: 11/11/2018  Today's Provider: Lelon Huh, MD   Chief Complaint  Patient presents with  . Hypertension   Subjective:    HPI  Hypertension, follow-up:  BP Readings from Last 3 Encounters:  11/11/18 (!) 142/72  11/04/18 (!) 146/82  05/10/18 136/80    He was last seen for hypertension 1 weeks ago.  BP at that visit was 164/88. Management since that visit includes adding Amlodipine 5mg  daily. He reports good compliance with treatment. He is not having side effects.  He is exercising. He is adherent to low salt diet.   Outside blood pressures are 165/70. He is experiencing none.  Patient denies chest pain, chest pressure/discomfort, claudication, dyspnea, exertional chest pressure/discomfort, fatigue, irregular heart beat, lower extremity edema, near-syncope, orthopnea, palpitations, paroxysmal nocturnal dyspnea, syncope and tachypnea.   Cardiovascular risk factors include hypertension and male gender.  Use of agents associated with hypertension: NSAIDS.     Weight trend: stable Wt Readings from Last 3 Encounters:  11/11/18 235 lb (106.6 kg)  11/04/18 234 lb (106.1 kg)  05/10/18 231 lb (104.8 kg)    Current diet: well balanced  ------------------------------------------------------------------------     No Known Allergies   Current Outpatient Medications:  .  amLODipine (NORVASC) 5 MG tablet, Take 1 tablet (5 mg total) by mouth daily., Disp: 30 tablet, Rfl: 0 .  aspirin EC 81 MG tablet, Take 81 mg by mouth daily., Disp: , Rfl:  .  atenolol-chlorthalidone (TENORETIC) 50-25 MG tablet, TAKE 0.5 TABLETS BY MOUTH DAILY., Disp: 45 tablet, Rfl: 4 .  atorvastatin (LIPITOR) 40 MG tablet, TAKE 1 TABLET BY MOUTH EVERY DAY, Disp: 30 tablet, Rfl: 6 .  BIOTIN PO, Take by mouth., Disp: , Rfl:  .  citalopram (CELEXA) 10 MG tablet, Take 1 tablet (10 mg total) by mouth daily., Disp:  90 tablet, Rfl: 3 .  Coenzyme Q10 (COQ10 PO), Take by mouth., Disp: , Rfl:  .  fluconazole (DIFLUCAN) 150 MG tablet, TAKE 1 TABLET BY MOUTH WEEKLY AS NEEDED, Disp: 4 tablet, Rfl: 3 .  GLUCOSAMINE-CHONDROITIN PO, Take by mouth., Disp: , Rfl:  .  ketoconazole (NIZORAL) 2 % cream, APPLY TOPICALLY DAILY AS NEEDED FOR IRRITATION. APPLY EXTERNALLY AS NEEDED, Disp: 30 g, Rfl: 3 .  lisinopril (PRINIVIL,ZESTRIL) 5 MG tablet, Take 1 tablet (5 mg total) by mouth daily., Disp: 90 tablet, Rfl: 4 .  LORazepam (ATIVAN) 0.5 MG tablet, TAKE 1 TABLET BY MOUTH EVERY NIGHT AT BEDTIME, Disp: 30 tablet, Rfl: 5 .  Multiple Vitamin (MULTIVITAMIN) tablet, Take 1 tablet by mouth daily., Disp: , Rfl:  .  Omega-3 Fatty Acids (FISH OIL PO), Take by mouth., Disp: , Rfl:  .  sildenafil (VIAGRA) 50 MG tablet, Take 1 tablet (50 mg total) by mouth daily as needed for erectile dysfunction., Disp: 8 tablet, Rfl: 3  Review of Systems  Constitutional: Negative for appetite change, chills and fever.  Respiratory: Negative for chest tightness and wheezing.   Gastrointestinal: Negative for abdominal pain, nausea and vomiting.  Neurological: Positive for headaches.    Social History   Tobacco Use  . Smoking status: Never Smoker  . Smokeless tobacco: Never Used  Substance Use Topics  . Alcohol use: Yes    Alcohol/week: 18.0 standard drinks    Types: 18 Standard drinks or equivalent per week    Comment: 1 case of beer per week (  18-24 beers)   Objective:   BP (!) 142/72 (BP Location: Left Arm, Cuff Size: Large)   Pulse (!) 54   Temp 97.7 F (36.5 C) (Oral)   Resp 18   Ht 6' (1.829 m)   Wt 235 lb (106.6 kg)   SpO2 95% Comment: room air  BMI 31.87 kg/m  Vitals:   11/11/18 0810 11/11/18 0815  BP: (!) 169/86 (!) 142/72  Pulse: (!) 54   Resp: 18   Temp: 97.7 F (36.5 C)   TempSrc: Oral   SpO2: 95%   Weight: 235 lb (106.6 kg)   Height: 6' (1.829 m)      Physical Exam  General appearance: alert, well developed,  well nourished, cooperative and in no distress Head: Normocephalic, without obvious abnormality, atraumatic Respiratory: Respirations even and unlabored, normal respiratory rate Extremities: No gross deformities Skin: Skin color, texture, turgor normal. No rashes seen  Psych: Appropriate mood and affect. Neurologic: Mental status: Alert, oriented to person, place, and time, thought content appropriate.     Assessment & Plan:     1. Essential (primary) hypertension Tolerating initiation of amlodipine, but need to get BP 140/90 for DMV. Will change lisinopril to benazepril and titrate up to 20mg . Recheck BP next week.  - benazepril (LOTENSIN) 20 MG tablet; 1/2 tablet daily for 2 days then increase to 1 full tablet daily  Dispense: 90 tablet; Refill: 0       Lelon Huh, MD  [facility]

## 2018-11-15 ENCOUNTER — Ambulatory Visit: Payer: Self-pay | Admitting: Family Medicine

## 2018-11-15 ENCOUNTER — Encounter: Payer: Self-pay | Admitting: Family Medicine

## 2018-11-15 ENCOUNTER — Ambulatory Visit (INDEPENDENT_AMBULATORY_CARE_PROVIDER_SITE_OTHER): Payer: 59 | Admitting: Family Medicine

## 2018-11-15 VITALS — BP 138/72 | HR 64 | Temp 98.0°F | Resp 16 | Wt 229.0 lb

## 2018-11-15 DIAGNOSIS — I10 Essential (primary) hypertension: Secondary | ICD-10-CM | POA: Diagnosis not present

## 2018-11-15 NOTE — Patient Instructions (Signed)
.   Please go to the lab draw station in [address] on the second floor of [facility] the week after thanksgiving. You do not need to fast.

## 2018-11-15 NOTE — Progress Notes (Signed)
Patient: Sergio King Male    DOB: Nov 21, 1961   57 y.o.   MRN: [id number] Visit Date: 11/15/2018  Today's Provider: Lelon Huh, MD   Chief Complaint  Patient presents with  . Hypertension   Subjective:    Hypertension  This is a chronic problem. The problem has been gradually improving since onset. Associated symptoms include headaches (Pt reports his headache just started today.). Pertinent negatives include no anxiety, blurred vision, chest pain, malaise/fatigue, neck pain, orthopnea, palpitations, peripheral edema, PND, shortness of breath or sweats. There are no associated agents to hypertension. There are no compliance problems.        No Known Allergies   Current Outpatient Medications:  .  amLODipine (NORVASC) 5 MG tablet, Take 1 tablet (5 mg total) by mouth daily., Disp: 30 tablet, Rfl: 0 .  aspirin EC 81 MG tablet, Take 81 mg by mouth daily., Disp: , Rfl:  .  atenolol-chlorthalidone (TENORETIC) 50-25 MG tablet, TAKE 0.5 TABLETS BY MOUTH DAILY., Disp: 45 tablet, Rfl: 4 .  atorvastatin (LIPITOR) 40 MG tablet, TAKE 1 TABLET BY MOUTH EVERY DAY, Disp: 30 tablet, Rfl: 6 .  benazepril (LOTENSIN) 20 MG tablet, 1/2 tablet daily for 2 days then increase to 1 full tablet daily, Disp: 90 tablet, Rfl: 0 .  BIOTIN PO, Take by mouth., Disp: , Rfl:  .  citalopram (CELEXA) 10 MG tablet, Take 1 tablet (10 mg total) by mouth daily., Disp: 90 tablet, Rfl: 3 .  Coenzyme Q10 (COQ10 PO), Take by mouth., Disp: , Rfl:  .  GLUCOSAMINE-CHONDROITIN PO, Take by mouth., Disp: , Rfl:  .  ketoconazole (NIZORAL) 2 % cream, APPLY TOPICALLY DAILY AS NEEDED FOR IRRITATION. APPLY EXTERNALLY AS NEEDED, Disp: 30 g, Rfl: 3 .  LORazepam (ATIVAN) 0.5 MG tablet, TAKE 1 TABLET BY MOUTH EVERY NIGHT AT BEDTIME, Disp: 30 tablet, Rfl: 5 .  Multiple Vitamin (MULTIVITAMIN) tablet, Take 1 tablet by mouth daily., Disp: , Rfl:  .  Omega-3 Fatty Acids (FISH OIL PO), Take by mouth., Disp: , Rfl:  .  sildenafil  (VIAGRA) 50 MG tablet, Take 1 tablet (50 mg total) by mouth daily as needed for erectile dysfunction., Disp: 8 tablet, Rfl: 3 .  fluconazole (DIFLUCAN) 150 MG tablet, TAKE 1 TABLET BY MOUTH WEEKLY AS NEEDED, Disp: 4 tablet, Rfl: 3  Review of Systems  Constitutional: Negative.  Negative for malaise/fatigue.  Eyes: Negative for blurred vision.  Respiratory: Negative.  Negative for shortness of breath.   Cardiovascular: Negative.  Negative for chest pain, palpitations, orthopnea and PND.  Gastrointestinal: Negative.   Musculoskeletal: Negative for neck pain.  Neurological: Positive for headaches (Pt reports his headache just started today.). Negative for dizziness and light-headedness.    Social History   Tobacco Use  . Smoking status: Never Smoker  . Smokeless tobacco: Never Used  Substance Use Topics  . Alcohol use: Yes    Alcohol/week: 18.0 standard drinks    Types: 18 Standard drinks or equivalent per week    Comment: 1 case of beer per week (18-24 beers)   Objective:   BP 138/72 (BP Location: Right Arm, Patient Position: Sitting, Cuff Size: Large)   Pulse 64   Temp 98 F (36.7 C) (Oral)   Resp 16   Wt 229 lb (103.9 kg)   BMI 31.06 kg/m  Vitals:   11/15/18 1548  BP: 138/72  Pulse: 64  Resp: 16  Temp: 98 F (36.7 C)  TempSrc: Oral  Weight:  229 lb (103.9 kg)     Physical Exam  General appearance: alert, well developed, well nourished, cooperative and in no distress Head: Normocephalic, without obvious abnormality, atraumatic Respiratory: Respirations even and unlabored, normal respiratory rate Extremities: No gross deformities Skin: Skin color, texture, turgor normal. No rashes seen  Psych: Appropriate mood and affect. Neurologic: Mental status: Alert, oriented to person, place, and time, thought content appropriate.     Assessment & Plan:     1. Essential (primary) hypertension Controlled with addition of amlodipine and change from lisinopril to 20mg   benazepril. Continue current medications.  Note for [employer] medical examiner printed.  - Renal function panel; Future in 10-14 days.        Lelon Huh, MD  [facility]

## 2018-11-26 ENCOUNTER — Other Ambulatory Visit: Payer: Self-pay | Admitting: Family Medicine

## 2018-11-26 DIAGNOSIS — I10 Essential (primary) hypertension: Secondary | ICD-10-CM

## 2018-11-28 DIAGNOSIS — I10 Essential (primary) hypertension: Secondary | ICD-10-CM | POA: Diagnosis not present

## 2018-11-29 ENCOUNTER — Telehealth: Payer: Self-pay

## 2018-11-29 LAB — RENAL FUNCTION PANEL
Albumin: 4.8 g/dL (ref 3.5–5.5)
BUN/Creatinine Ratio: 18 (ref 9–20)
BUN: 17 mg/dL (ref 6–24)
CALCIUM: 9.6 mg/dL (ref 8.7–10.2)
CO2: 21 mmol/L (ref 20–29)
CREATININE: 0.95 mg/dL (ref 0.76–1.27)
Chloride: 101 mmol/L (ref 96–106)
GFR calc Af Amer: 102 mL/min/{1.73_m2} (ref >59)
GFR calc non Af Amer: 88 mL/min/{1.73_m2} (ref >59)
Glucose: 102 mg/dL — ABNORMAL HIGH (ref 65–99)
PHOSPHORUS: 3.4 mg/dL (ref 2.5–4.5)
Potassium: 4.2 mmol/L (ref 3.5–5.2)
SODIUM: 139 mmol/L (ref 134–144)

## 2018-11-29 NOTE — Telephone Encounter (Signed)
Patient was advised and schedule appointment for 02/01/2019 @ 4:20pm

## 2018-11-29 NOTE — Telephone Encounter (Signed)
-----   Message from Birdie Sons, MD sent at 11/29/2018  7:42 AM EST ----- Kidney functions and electrolytes are all stable. Can continue on amlodipine and benazepril. Follow up for BP in about 2 months.

## 2019-01-10 DIAGNOSIS — J0100 Acute maxillary sinusitis, unspecified: Secondary | ICD-10-CM | POA: Diagnosis not present

## 2019-01-10 DIAGNOSIS — R091 Pleurisy: Secondary | ICD-10-CM | POA: Diagnosis not present

## 2019-02-01 ENCOUNTER — Ambulatory Visit (INDEPENDENT_AMBULATORY_CARE_PROVIDER_SITE_OTHER): Payer: 59 | Admitting: Family Medicine

## 2019-02-01 ENCOUNTER — Encounter: Payer: Self-pay | Admitting: Family Medicine

## 2019-02-01 DIAGNOSIS — I10 Essential (primary) hypertension: Secondary | ICD-10-CM

## 2019-02-01 MED ORDER — BENAZEPRIL HCL 20 MG PO TABS: 20.0000 mg | ORAL_TABLET | Freq: Every day | ORAL | 0 refills | Status: DC

## 2019-02-01 MED ORDER — AMLODIPINE BESYLATE 5 MG PO TABS: 10.0000 mg | ORAL_TABLET | Freq: Every day | ORAL | 0 refills | Status: DC

## 2019-02-01 NOTE — Progress Notes (Signed)
Patient: Sergio King Male    DOB: 06-Jan-1961   58 y.o.   MRN: [id number] Visit Date: 02/01/2019  Today's Provider: Lelon Huh, MD   Chief Complaint  Patient presents with  . Hypertension   Subjective:     HPI  Hypertension, follow-up:  BP Readings from Last 3 Encounters:  02/01/19 (!) 160/68  11/15/18 138/72  11/11/18 (!) 142/72    He was last seen for hypertension 2 months ago.  BP at that visit was 138/72. Management since that visit includes no changes. He reports good compliance with treatment. He is not having side effects.  He is exercising. He is adherent to low salt diet.   Outside blood pressures are not being checked. He is experiencing none.  Patient denies chest pain, chest pressure/discomfort, claudication, dyspnea, exertional chest pressure/discomfort, fatigue, irregular heart beat, lower extremity edema, near-syncope, orthopnea, palpitations, paroxysmal nocturnal dyspnea, syncope and tachypnea.   Cardiovascular risk factors include advanced age (older than 33 for men, 51 for women), hypertension and male gender.  Use of agents associated with hypertension: NSAIDS.     Weight trend: increasing steadily Wt Readings from Last 3 Encounters:  02/01/19 240 lb (108.9 kg)  11/15/18 229 lb (103.9 kg)  11/11/18 235 lb (106.6 kg)    Current diet: well balanced  ------------------------------------------------------------------------  No Known Allergies   Current Outpatient Medications:  .  amLODipine (NORVASC) 5 MG tablet, TAKE 1 TABLET BY MOUTH EVERY DAY, Disp: 30 tablet, Rfl: 11 .  aspirin EC 81 MG tablet, Take 81 mg by mouth daily., Disp: , Rfl:  .  atenolol-chlorthalidone (TENORETIC) 50-25 MG tablet, TAKE 0.5 TABLETS BY MOUTH DAILY., Disp: 45 tablet, Rfl: 4 .  atorvastatin (LIPITOR) 40 MG tablet, TAKE 1 TABLET BY MOUTH EVERY DAY, Disp: 30 tablet, Rfl: 6 .  benazepril (LOTENSIN) 20 MG tablet, 1/2 tablet daily for 2 days then increase to 1  full tablet daily, Disp: 90 tablet, Rfl: 0 .  BIOTIN PO, Take by mouth., Disp: , Rfl:  .  citalopram (CELEXA) 10 MG tablet, Take 1 tablet (10 mg total) by mouth daily., Disp: 90 tablet, Rfl: 3 .  Coenzyme Q10 (COQ10 PO), Take by mouth., Disp: , Rfl:  .  fluconazole (DIFLUCAN) 150 MG tablet, TAKE 1 TABLET BY MOUTH WEEKLY AS NEEDED, Disp: 4 tablet, Rfl: 3 .  GLUCOSAMINE-CHONDROITIN PO, Take by mouth., Disp: , Rfl:  .  ketoconazole (NIZORAL) 2 % cream, APPLY TOPICALLY DAILY AS NEEDED FOR IRRITATION. APPLY EXTERNALLY AS NEEDED, Disp: 30 g, Rfl: 3 .  Multiple Vitamin (MULTIVITAMIN) tablet, Take 1 tablet by mouth daily., Disp: , Rfl:  .  Omega-3 Fatty Acids (FISH OIL PO), Take by mouth., Disp: , Rfl:  .  sildenafil (VIAGRA) 50 MG tablet, Take 1 tablet (50 mg total) by mouth daily as needed for erectile dysfunction., Disp: 8 tablet, Rfl: 3 .  LORazepam (ATIVAN) 0.5 MG tablet, TAKE 1 TABLET BY MOUTH EVERY NIGHT AT BEDTIME (Patient not taking: Reported on 02/01/2019), Disp: 30 tablet, Rfl: 5  Review of Systems  Constitutional: Negative for appetite change, chills and fever.  HENT: Positive for congestion (in chest).   Respiratory: Negative for chest tightness, shortness of breath and wheezing.   Cardiovascular: Negative for chest pain and palpitations.  Gastrointestinal: Negative for abdominal pain, nausea and vomiting.    Social History   Tobacco Use  . Smoking status: Never Smoker  . Smokeless tobacco: Never Used  Substance Use Topics  .  Alcohol use: Yes    Alcohol/week: 18.0 standard drinks    Types: 18 Standard drinks or equivalent per week    Comment: 1 case of beer per week (18-24 beers)      Objective:   BP (!) 160/68 (BP Location: Left Arm, Cuff Size: Large)   Pulse (!) 58   Temp 97.7 F (36.5 C) (Oral)   Resp 18   Wt 240 lb (108.9 kg)   SpO2 96% Comment: room air  BMI 32.55 kg/m  Vitals:   02/01/19 1634 02/01/19 1636  BP: (!) 148/72 (!) 160/68  Pulse: (!) 58   Resp: 18     Temp: 97.7 F (36.5 C)   TempSrc: Oral   SpO2: 96%   Weight: 240 lb (108.9 kg)      Physical Exam   General Appearance:    Alert, cooperative, no distress  Eyes:    PERRL, conjunctiva/corneas clear, EOM's intact       Lungs:     Clear to auscultation bilaterally, respirations unlabored  Heart:    Regular rate and rhythm  Neurologic:   Awake, alert, oriented x 3. No apparent focal neurological           defect.          Assessment & Plan    1. Essential (primary) hypertension Uncontrolled. Will double amlodipine to - amLODipine (NORVASC) 5 MG tablet; Take 2 tablets (10 mg total) by mouth daily.  Dispense: 3 tablet; Refill: 0  He had 90 tablets of 5mg  dispensed on 01-06-2019. Will send prescription for 10mg  tablets before the 5mg  tablets run out at the end of the month.      Lelon Huh, MD  [facility]

## 2019-02-01 NOTE — Patient Instructions (Addendum)
   Increase amlodipine to 2 (two) 5mg  tablets every day until your current bottle is empty. I'll send in a new prescription for 10mg  tablets before the 5mg  tablets run out.

## 2019-02-02 ENCOUNTER — Other Ambulatory Visit: Payer: Self-pay | Admitting: Family Medicine

## 2019-02-02 DIAGNOSIS — I10 Essential (primary) hypertension: Secondary | ICD-10-CM

## 2019-02-08 ENCOUNTER — Other Ambulatory Visit: Payer: Self-pay | Admitting: Family Medicine

## 2019-02-13 ENCOUNTER — Other Ambulatory Visit: Payer: Self-pay | Admitting: Family Medicine

## 2019-02-13 DIAGNOSIS — N529 Male erectile dysfunction, unspecified: Secondary | ICD-10-CM

## 2019-02-23 ENCOUNTER — Other Ambulatory Visit: Payer: Self-pay | Admitting: Family Medicine

## 2019-02-23 DIAGNOSIS — I10 Essential (primary) hypertension: Secondary | ICD-10-CM

## 2019-02-23 MED ORDER — AMLODIPINE BESYLATE 10 MG PO TABS: 10.0000 mg | ORAL_TABLET | Freq: Every day | ORAL | 3 refills | Status: DC

## 2019-03-16 ENCOUNTER — Other Ambulatory Visit: Payer: Self-pay | Admitting: Family Medicine

## 2019-04-03 ENCOUNTER — Other Ambulatory Visit: Payer: Self-pay | Admitting: Family Medicine

## 2019-04-23 ENCOUNTER — Other Ambulatory Visit: Payer: Self-pay | Admitting: Family Medicine

## 2019-04-26 ENCOUNTER — Encounter: Payer: 59 | Admitting: Family Medicine

## 2019-06-21 ENCOUNTER — Telehealth: Payer: Self-pay | Admitting: Family Medicine

## 2019-06-21 NOTE — Telephone Encounter (Signed)
Im sending a copy of a note that Dr. Agustina Caroli from [facility] gave patient to bring here.   Ziyad needs a note of clearance for Lorazepam use and Citalopram.  Call patient when note ready.

## 2019-06-26 ENCOUNTER — Other Ambulatory Visit: Payer: Self-pay | Admitting: Family Medicine

## 2019-07-03 NOTE — Telephone Encounter (Signed)
Letter at the front desk.  Pt advised.   Thanks,   -Mickel Baas

## 2019-07-14 ENCOUNTER — Encounter: Payer: 59 | Admitting: Family Medicine

## 2019-08-04 ENCOUNTER — Other Ambulatory Visit: Payer: Self-pay | Admitting: Family Medicine

## 2019-08-04 DIAGNOSIS — N529 Male erectile dysfunction, unspecified: Secondary | ICD-10-CM

## 2019-08-04 DIAGNOSIS — I10 Essential (primary) hypertension: Secondary | ICD-10-CM

## 2019-09-29 ENCOUNTER — Other Ambulatory Visit: Payer: Self-pay

## 2019-09-29 ENCOUNTER — Encounter: Payer: 59 | Admitting: Family Medicine

## 2019-11-28 ENCOUNTER — Ambulatory Visit (INDEPENDENT_AMBULATORY_CARE_PROVIDER_SITE_OTHER): Payer: 59 | Admitting: Family Medicine

## 2019-11-28 ENCOUNTER — Other Ambulatory Visit: Payer: Self-pay

## 2019-11-28 ENCOUNTER — Encounter: Payer: Self-pay | Admitting: Family Medicine

## 2019-11-28 VITALS — BP 138/70 | HR 59 | Temp 97.1°F | Resp 16 | Ht 72.0 in | Wt 233.0 lb

## 2019-11-28 DIAGNOSIS — Z23 Encounter for immunization: Secondary | ICD-10-CM

## 2019-11-28 DIAGNOSIS — F4320 Adjustment disorder, unspecified: Secondary | ICD-10-CM

## 2019-11-28 DIAGNOSIS — Z86010 Personal history of colonic polyps: Secondary | ICD-10-CM

## 2019-11-28 DIAGNOSIS — Z872 Personal history of diseases of the skin and subcutaneous tissue: Secondary | ICD-10-CM

## 2019-11-28 DIAGNOSIS — I2510 Atherosclerotic heart disease of native coronary artery without angina pectoris: Secondary | ICD-10-CM

## 2019-11-28 DIAGNOSIS — Z0000 Encounter for general adult medical examination without abnormal findings: Secondary | ICD-10-CM

## 2019-11-28 DIAGNOSIS — I10 Essential (primary) hypertension: Secondary | ICD-10-CM

## 2019-11-28 DIAGNOSIS — Z125 Encounter for screening for malignant neoplasm of prostate: Secondary | ICD-10-CM

## 2019-11-28 DIAGNOSIS — E781 Pure hyperglyceridemia: Secondary | ICD-10-CM

## 2019-11-28 MED ORDER — CITALOPRAM HYDROBROMIDE 20 MG PO TABS: 20.0000 mg | ORAL_TABLET | Freq: Every day | ORAL | 30 refills | Status: DC

## 2019-11-28 NOTE — Progress Notes (Signed)
Patient: Sergio King, Male    DOB: 03-27-1961, 58 y.o.   MRN: [id number] Visit Date: 11/28/2019  Today's Provider: Lelon Huh, MD   Chief Complaint  Patient presents with  . Annual Exam   Subjective:     Annual physical exam Sergio King is a 58 y.o. male who presents today for health maintenance and complete physical. He feels fairly well. He reports no regular exercising. He reports he is sleeping fairly well.  -----------------------------------------------------------------  Hypertension, follow-up: BP Readings from Last 3 Encounters:  11/28/19 138/70  02/01/19 (!) 160/68  11/15/18 138/72   He was last seen for hypertension 10 months ago.  BP at that visit was 160/68. Management since that visit includes increase Amlodipine to 10 mg daily He reports good compliance with treatment. He is not having side effects.  He is exercising. He is adherent to low salt diet.   Outside blood pressures are not checked. He is experiencing none.  Patient denies chest pain, chest pressure/discomfort, claudication, dyspnea, exertional chest pressure/discomfort, fatigue, irregular heart beat, lower extremity edema, near-syncope, orthopnea, palpitations, paroxysmal nocturnal dyspnea, syncope and tachypnea.   Cardiovascular risk factors include hypertension and male gender.  Use of agents associated with hypertension: NSAIDS.                Weight trend: fluctuating a bit Wt Readings from Last 3 Encounters:  11/28/19 233 lb (105.7 kg)  02/01/19 240 lb (108.9 kg)  11/15/18 229 lb (103.9 kg)    Current diet: well balanced  ------------------------------------------------------------------------ Follow up CAD: Patient was last seen for this problem over 1 year ago and no changes were made. Patient was advised to continue routine follow ups with Dr. Clayborn Bigness.  Follow up of Hyperglycemia: Patient was last seen for this problem over 1 year ago and no changes were  made.               Lab Results  Component Value Date   HGBA1C 5.4 03/04/2018      Lipid/Cholesterol, Follow-up:   Last seen for this over 1 year ago.  Management changes since that visit include none.  Last Lipid Panel: Lab Results  Component Value Date   CHOL 152 03/04/2018   HDL 42 03/04/2018   LDLCALC 79 03/04/2018   TRIG 156 (H) 03/04/2018   CHOLHDL 3.6 03/04/2018    Risk factors for vascular disease include hypercholesterolemia and hypertension  He reports good compliance with treatment. He is not having side effects.  Current symptoms include none  Weight trend: fluctuating a bit Prior visit with dietician: no Current diet: well balanced Current exercise: cardiovascular workout on exercise equipment  Wt Readings from Last 3 Encounters:  11/28/19 233 lb (105.7 kg)  02/01/19 240 lb (108.9 kg)  11/15/18 229 lb (103.9 kg)   -------------------------------------------------------------------  He has had considerably more stress especially with work lately. He stays physically active, but is having more difficulty managing stress. Is taking 10mg  citalopram consistently with no adverse effects. Has been drinking alcohol (beer) more heavily and frequently, but has made conscious effort to cut back recently.   Review of Systems  Constitutional: Negative.  Negative for appetite change, chills, fatigue and fever.  HENT: Negative.  Negative for congestion, ear pain, hearing loss, nosebleeds and trouble swallowing.   Eyes: Negative.  Negative for pain and visual disturbance.  Respiratory: Negative.  Negative for cough, chest tightness and shortness of breath.   Cardiovascular: Negative.  Negative for chest  pain, palpitations and leg swelling.  Gastrointestinal: Negative.  Negative for abdominal pain, blood in stool, constipation, diarrhea, nausea and vomiting.  Endocrine: Negative.  Negative for polydipsia, polyphagia and polyuria.  Genitourinary: Negative.  Negative  for dysuria and flank pain.  Musculoskeletal: Negative.  Negative for arthralgias, back pain, joint swelling, myalgias and neck stiffness.  Skin: Positive for rash. Negative for color change and wound.  Allergic/Immunologic: Negative.   Neurological: Negative.  Negative for dizziness, tremors, seizures, speech difficulty, weakness, light-headedness and headaches.  Hematological: Negative.   Psychiatric/Behavioral: Negative.  Negative for behavioral problems, confusion, decreased concentration, dysphoric mood and sleep disturbance. The patient is not nervous/anxious.   All other systems reviewed and are negative.   Social History      He  reports that he has never smoked. He has never used smokeless tobacco. He reports current alcohol use of about 18.0 standard drinks of alcohol per week. He reports that he does not use drugs.       Social History   Socioeconomic History  . Marital status: Divorced    Spouse name: Not on file  . Number of children: 2  . Years of education: Not on file  . Highest education level: Not on file  Occupational History  . Occupation: Employed    Fish farm manager: [employer]    Comment: full time  Social Needs  . Financial resource strain: Not on file  . Food insecurity    Worry: Not on file    Inability: Not on file  . Transportation needs    Medical: Not on file    Non-medical: Not on file  Tobacco Use  . Smoking status: Never Smoker  . Smokeless tobacco: Never Used  Substance and Sexual Activity  . Alcohol use: Yes    Alcohol/week: 18.0 standard drinks    Types: 18 Standard drinks or equivalent per week    Comment: 1 case of beer per week (18-24 beers)  . Drug use: No  . Sexual activity: Not on file  Lifestyle  . Physical activity    Days per week: Not on file    Minutes per session: Not on file  . Stress: Not on file  Relationships  . Social Herbalist on phone: Not on file    Gets together: Not on file    Attends religious service:  Not on file    Active member of club or organization: Not on file    Attends meetings of clubs or organizations: Not on file    Relationship status: Not on file  Other Topics Concern  . Not on file  Social History Narrative  . Not on file    Past Medical History:  Diagnosis Date  . Hammertoe of left foot   . Hypertension      Patient Active Problem List   Diagnosis Date Noted  . Erectile dysfunction 03/04/2018  . Screening for colorectal cancer   . Polyp of sigmoid colon   . Benign neoplasm of transverse colon   . CAD (coronary artery disease) 06/24/2015  . Hyperglycemia 06/24/2015  . Adult situational stress disorder 06/18/2015  . Hypersomnia 08/08/2009  . Fam hx-ischem heart disease 09/13/2007  . Hyperglyceridemia, pure 12/16/2005  . Essential (primary) hypertension 12/28/2004    Past Surgical History:  Procedure Laterality Date  . CARDIAC CATHETERIZATION     2 stents placed. over 3 yrs ago. [facility]  . COLONOSCOPY WITH PROPOFOL N/A 09/24/2017   Procedure: COLONOSCOPY WITH PROPOFOL;  Surgeon:  Lucilla Lame, MD;  Location: [facility];  Service: Gastroenterology;  Laterality: N/A;  . CORONARY STENT PLACEMENT Left 10/12/2012   99% LAD and 75% first diagonal [facility]  . FOOT SURGERY Right 2007   Hammer toe surgery  . POLYPECTOMY  09/24/2017   Procedure: POLYPECTOMY;  Surgeon: Lucilla Lame, MD;  Location: [place];  Service: Gastroenterology;;  . [place]    Family History        Family Status  Relation Name Status  . Mother  Alive       DDD, Gallbladder removed  . Father  Alive  . Brother  Alive  . Brother  Alive  . Brother  Alive        His family history includes CAD in his father.      No Known Allergies   Current Outpatient Medications:  .  amLODipine (NORVASC) 10 MG tablet, Take 1 tablet (10 mg total) by mouth daily., Disp: 90 tablet, Rfl: 3 .  aspirin EC 81 MG tablet, Take 81 mg by mouth daily., Disp: , Rfl:  .   atenolol-chlorthalidone (TENORETIC) 50-25 MG tablet, TAKE 0.5 TABLETS BY MOUTH DAILY., Disp: 45 tablet, Rfl: 1 .  atorvastatin (LIPITOR) 40 MG tablet, TAKE 1 TABLET BY MOUTH EVERY DAY, Disp: 90 tablet, Rfl: 4 .  benazepril (LOTENSIN) 20 MG tablet, TAKE 1 TABLET BY MOUTH EVERY DAY, Disp: 90 tablet, Rfl: 1 .  BIOTIN PO, Take by mouth., Disp: , Rfl:  .  citalopram (CELEXA) 10 MG tablet, TAKE 1 TABLET BY MOUTH EVERY DAY, Disp: 90 tablet, Rfl: 3 .  Coenzyme Q10 (COQ10 PO), Take by mouth., Disp: , Rfl:  .  fluconazole (DIFLUCAN) 150 MG tablet, TAKE 1 TABLET BY MOUTH WEEKLY AS NEEDED, Disp: 4 tablet, Rfl: 3 .  GLUCOSAMINE-CHONDROITIN PO, Take by mouth., Disp: , Rfl:  .  ketoconazole (NIZORAL) 2 % cream, APPLY TOPICALLY DAILY AS NEEDED FOR IRRITATION. APPLY EXTERNALLY AS NEEDED, Disp: 30 g, Rfl: 3 .  LORazepam (ATIVAN) 0.5 MG tablet, TAKE 1 TABLET BY MOUTH EVERY NIGHT AT BEDTIME (Patient not taking: Reported on 02/01/2019), Disp: 30 tablet, Rfl: 5 .  Multiple Vitamin (MULTIVITAMIN) tablet, Take 1 tablet by mouth daily., Disp: , Rfl:  .  Omega-3 Fatty Acids (FISH OIL PO), Take by mouth., Disp: , Rfl:  .  sildenafil (VIAGRA) 50 MG tablet, TAKE 1 TABLET BY MOUTH DAILY AS NEEDED FOR ERECTILE DYSFUNCTION., Disp: 6 tablet, Rfl: 5   Patient Care Team: Birdie Sons, MD as PCP - General (Family Medicine) Yolonda Kida, MD as Consulting Physician (Cardiology)    Objective:    Vitals: BP 138/70 (BP Location: Left Arm, Patient Position: Sitting, Cuff Size: Large)   Pulse (!) 59   Temp (!) 97.1 F (36.2 C) (Temporal)   Resp 16   Ht 6' (1.829 m)   Wt 233 lb (105.7 kg)   SpO2 97% Comment: room air  BMI 31.60 kg/m    Vitals:   11/28/19 1500  BP: 138/70  Pulse: (!) 59  Resp: 16  Temp: (!) 97.1 F (36.2 C)  TempSrc: Temporal  SpO2: 97%  Weight: 233 lb (105.7 kg)  Height: 6' (1.829 m)     Physical Exam   General Appearance:    Overweight male. Alert, cooperative, in no acute distress,  appears stated age  Head:    Normocephalic, without obvious abnormality, atraumatic  Eyes:    PERRL, conjunctiva/corneas clear, EOM's intact, fundi    benign, both  eyes       Ears:    Normal TM's and external ear canals, both ears  Nose:   Nares normal, septum midline, mucosa normal, no drainage   or sinus tenderness  Throat:   Lips, mucosa, and tongue normal; teeth and gums normal  Neck:   Supple, symmetrical, trachea midline, no adenopathy;       thyroid:  No enlargement/tenderness/nodules; no carotid   bruit or JVD  Back:     Symmetric, no curvature, ROM normal, no CVA tenderness  Lungs:     Clear to auscultation bilaterally, respirations unlabored  Chest wall:    No tenderness or deformity  Heart:    Bradycardic. Normal rhythm. No murmurs, rubs, or gallops.  S1 and S2 normal  Abdomen:     Soft, non-tender, bowel sounds active all four quadrants,    no masses, no organomegaly  Genitalia:    deferred  Rectal:    deferred  Extremities:   All extremities are intact. No cyanosis or edema  Pulses:   2+ and symmetric all extremities  Skin:   Skin color, texture, turgor normal, no rashes or lesions  Lymph nodes:   Cervical, supraclavicular, and axillary nodes normal  Neurologic:   CNII-XII intact. Normal strength, sensation and reflexes      throughout    Depression Screen PHQ 2/9 Scores 11/28/2019 03/04/2018 08/23/2017 06/24/2015  PHQ - 2 Score 0 0 0 0  PHQ- 9 Score 0 0 0 2       Assessment & Plan:     Routine Health Maintenance and Physical Exam  Exercise Activities and Dietary recommendations Goals   None     Immunization History  Administered Date(s) Administered  . Influenza-Unspecified 10/29/2017, 09/27/2018  . Td 03/04/2018  . Tdap 09/28/2007    Health Maintenance  Topic Date Due  . INFLUENZA VACCINE  07/29/2019  . COLONOSCOPY  09/24/2022  . TETANUS/TDAP  03/04/2028  . Hepatitis C Screening  Completed  . HIV Screening  Completed     Discussed health  benefits of physical activity, and encouraged him to engage in regular exercise appropriate for his age and condition.    --------------------------------------------------------------------  1. Annual physical exam   2. Essential (primary) hypertension Well controlled.  Continue current medications.    3. Adult situational stress disorder Secondary to increased stress from work and likely contributing to increased alcohol consumption. Will increase citalopram to 20mg  daily, work on cutting back on EtOH and follow up in 2-3 months.   4. Coronary artery disease involving native coronary artery of native heart without angina pectoris Asymptomatic. Compliant with medication.  Continue aggressive risk factor modification.  Continue regular follow up with Dr. Clayborn Bigness.   5. Hyperglyceridemia, pure He is tolerating atorvastatin well with no adverse effects.   - Comprehensive metabolic panel - Lipid panel (fasting) - CBC  7. Personal history of actinic keratosis Continue routine follow up [facility]  7. Need for influenza vaccination  - Flu Vaccine QUAD 6+ mos PF IM (Fluarix Quad PF)  8. Need for shingles vaccine  - Varicella-zoster vaccine IM (Shingrix) #1  9. Prostate cancer screening  - PSA  10. History of adenomatous polyp of colon Colonoscopy per Dr. Allen Norris 2023     Lelon Huh, MD  [facility]

## 2019-11-28 NOTE — Patient Instructions (Addendum)
.   Please review the attached list of medications and notify my office if there are any errors.   . Please bring all of your medications to every appointment so we can make sure that our medication list is the same as yours.   . Please go to the lab draw station in [address] on the second floor of [facility]  when you are fasting for 8 hours. Normal hours are 8:00am to 12:30pm and 1:30pm to 4:00pm Monday through Friday   Limit alcohol consumption to no more than 3 servings per day and 10 servings each week

## 2019-12-02 ENCOUNTER — Other Ambulatory Visit: Payer: Self-pay | Admitting: Family Medicine

## 2019-12-06 LAB — COMPREHENSIVE METABOLIC PANEL
ALT: 38 IU/L (ref 0–44)
AST: 26 IU/L (ref 0–40)
Albumin/Globulin Ratio: 2.4 — ABNORMAL HIGH (ref 1.2–2.2)
Albumin: 4.7 g/dL (ref 3.8–4.9)
Alkaline Phosphatase: 88 IU/L (ref 39–117)
BUN/Creatinine Ratio: 16 (ref 9–20)
BUN: 15 mg/dL (ref 6–24)
Bilirubin Total: 0.6 mg/dL (ref 0.0–1.2)
CO2: 22 mmol/L (ref 20–29)
Calcium: 9.7 mg/dL (ref 8.7–10.2)
Chloride: 101 mmol/L (ref 96–106)
Creatinine, Ser: 0.93 mg/dL (ref 0.76–1.27)
GFR calc Af Amer: 104 mL/min/{1.73_m2} (ref >59)
GFR calc non Af Amer: 90 mL/min/{1.73_m2} (ref >59)
Globulin, Total: 2 g/dL (ref 1.5–4.5)
Glucose: 121 mg/dL — ABNORMAL HIGH (ref 65–99)
Potassium: 4.2 mmol/L (ref 3.5–5.2)
Sodium: 139 mmol/L (ref 134–144)
Total Protein: 6.7 g/dL (ref 6.0–8.5)

## 2019-12-06 LAB — CBC
Hematocrit: 48.6 % (ref 37.5–51.0)
Hemoglobin: 16.3 g/dL (ref 13.0–17.7)
MCH: 29.4 pg (ref 26.6–33.0)
MCHC: 33.5 g/dL (ref 31.5–35.7)
MCV: 88 fL (ref 79–97)
Platelets: 300 10*3/uL (ref 150–450)
RBC: 5.54 x10E6/uL (ref 4.14–5.80)
RDW: 12.7 % (ref 11.6–15.4)
WBC: 5.4 10*3/uL (ref 3.4–10.8)

## 2019-12-06 LAB — LIPID PANEL
Chol/HDL Ratio: 3.5 ratio (ref 0.0–5.0)
Cholesterol, Total: 152 mg/dL (ref 100–199)
HDL: 43 mg/dL (ref >39)
LDL Chol Calc (NIH): 80 mg/dL (ref 0–99)
Triglycerides: 168 mg/dL — ABNORMAL HIGH (ref 0–149)
VLDL Cholesterol Cal: 29 mg/dL (ref 5–40)

## 2019-12-06 LAB — PSA: Prostate Specific Ag, Serum: 1.4 ng/mL (ref 0.0–4.0)

## 2020-01-31 ENCOUNTER — Other Ambulatory Visit: Payer: Self-pay | Admitting: Family Medicine

## 2020-01-31 DIAGNOSIS — I10 Essential (primary) hypertension: Secondary | ICD-10-CM

## 2020-02-20 ENCOUNTER — Other Ambulatory Visit (HOSPITAL_COMMUNITY)
Admission: RE | Admit: 2020-02-20 | Discharge: 2020-02-20 | Disposition: A | Discharge status: Home / Self Care | Payer: 59 | Source: Ambulatory Visit | Attending: Family Medicine | Admitting: Family Medicine

## 2020-02-20 ENCOUNTER — Ambulatory Visit (INDEPENDENT_AMBULATORY_CARE_PROVIDER_SITE_OTHER): Payer: 59 | Admitting: Family Medicine

## 2020-02-20 ENCOUNTER — Other Ambulatory Visit: Payer: Self-pay

## 2020-02-20 ENCOUNTER — Encounter: Payer: Self-pay | Admitting: Family Medicine

## 2020-02-20 VITALS — BP 145/75 | HR 52 | Temp 97.7°F | Wt 237.6 lb

## 2020-02-20 DIAGNOSIS — F4320 Adjustment disorder, unspecified: Secondary | ICD-10-CM | POA: Diagnosis not present

## 2020-02-20 DIAGNOSIS — I10 Essential (primary) hypertension: Secondary | ICD-10-CM | POA: Diagnosis not present

## 2020-02-20 DIAGNOSIS — Z23 Encounter for immunization: Secondary | ICD-10-CM

## 2020-02-20 DIAGNOSIS — Z113 Encounter for screening for infections with a predominantly sexual mode of transmission: Secondary | ICD-10-CM | POA: Diagnosis not present

## 2020-02-20 NOTE — Patient Instructions (Signed)
.   Please review the attached list of medications and notify my office if there are any errors.   . Please bring all of your medications to every appointment so we can make sure that our medication list is the same as yours.   

## 2020-02-20 NOTE — Progress Notes (Signed)
Patient: Sergio King Male    DOB: [birth year]   59 y.o.   MRN: [id number] Visit Date: 02/20/2020  Today's Provider: Lelon Huh, MD   Chief Complaint  Patient presents with  . Situational stress   Subjective:     HPI  Follow up for Acute Situational stress:  The patient was last seen for this 2 months ago. Changes made at last visit include increasing citalopram to 20mg  daily and advising patient to work on cutting back on ETOH.  He reports good compliance with treatment. He feels that condition is Improved. He is not having side effects.    He also states he recently entered new relationship and would like STD screening. ------------------------------------------------------------------------------------  No Known Allergies   Current Outpatient Medications:  .  amLODipine (NORVASC) 10 MG tablet, Take 1 tablet (10 mg total) by mouth daily., Disp: 90 tablet, Rfl: 3 .  aspirin EC 81 MG tablet, Take 81 mg by mouth daily., Disp: , Rfl:  .  atenolol-chlorthalidone (TENORETIC) 50-25 MG tablet, TAKE 1/2 TABLET BY MOUTH EVERY DAY, Disp: 45 tablet, Rfl: 1 .  atorvastatin (LIPITOR) 40 MG tablet, TAKE 1 TABLET BY MOUTH EVERY DAY, Disp: 90 tablet, Rfl: 4 .  benazepril (LOTENSIN) 20 MG tablet, TAKE 1 TABLET BY MOUTH EVERY DAY, Disp: 30 tablet, Rfl: 0 .  BIOTIN PO, Take by mouth., Disp: , Rfl:  .  citalopram (CELEXA) 20 MG tablet, Take 1 tablet (20 mg total) by mouth daily., Disp: 20 tablet, Rfl: 30 .  Coenzyme Q10 (COQ10 PO), Take by mouth., Disp: , Rfl:  .  fluconazole (DIFLUCAN) 150 MG tablet, TAKE 1 TABLET BY MOUTH WEEKLY AS NEEDED, Disp: 4 tablet, Rfl: 3 .  GLUCOSAMINE-CHONDROITIN PO, Take by mouth., Disp: , Rfl:  .  ketoconazole (NIZORAL) 2 % cream, APPLY TOPICALLY DAILY AS NEEDED FOR IRRITATION. APPLY EXTERNALLY AS NEEDED, Disp: 30 g, Rfl: 3 .  LORazepam (ATIVAN) 0.5 MG tablet, TAKE 1 TABLET BY MOUTH EVERY NIGHT AT BEDTIME, Disp: 30 tablet, Rfl: 5 .  Multiple  Vitamin (MULTIVITAMIN) tablet, Take 1 tablet by mouth daily., Disp: , Rfl:  .  Omega-3 Fatty Acids (FISH OIL PO), Take by mouth., Disp: , Rfl:  .  sildenafil (VIAGRA) 50 MG tablet, TAKE 1 TABLET BY MOUTH DAILY AS NEEDED FOR ERECTILE DYSFUNCTION., Disp: 6 tablet, Rfl: 5  Review of Systems  Constitutional: Negative for appetite change, chills and fever.  Respiratory: Negative for chest tightness, shortness of breath and wheezing.   Cardiovascular: Negative for chest pain and palpitations.  Gastrointestinal: Negative for abdominal pain, nausea and vomiting.    Social History   Tobacco Use  . Smoking status: Never Smoker  . Smokeless tobacco: Never Used  Substance Use Topics  . Alcohol use: Yes    Alcohol/week: 18.0 standard drinks    Types: 18 Standard drinks or equivalent per week    Comment: 1 case of beer per week (18-24 beers)      Objective:   BP (!) 145/75 (BP Location: Right Arm, Patient Position: Sitting, Cuff Size: Large)   Pulse (!) 52   Temp 97.7 F (36.5 C) (Temporal)   Wt 237 lb 9.6 oz (107.8 kg)   BMI 32.22 kg/m  Vitals:   02/20/20 1553  BP: (!) 145/75  Pulse: (!) 52  Temp: 97.7 F (36.5 C)  TempSrc: Temporal  Weight: 237 lb 9.6 oz (107.8 kg)  Body mass index is 32.22 kg/m.   Physical Exam  General appearance: Overweight male, cooperative and in no acute distress Head: Normocephalic, without obvious abnormality, atraumatic Respiratory: Respirations even and unlabored, normal respiratory rate Extremities: All extremities are intact.  Skin: Skin color, texture, turgor normal. No rashes seen  Psych: Appropriate mood and affect. Neurologic: Mental status: Alert, oriented to person, place, and time, thought content appropriate.      Assessment & Plan    1. Adult situational stress disorder Doing better since December when citalopram was increased. He feels this is helping, but has also had less stressful work environment. Continue current dose of 40mg  a  day for now and reassess in 6 months.   2. Screen for STD (sexually transmitted disease)  - HIV Antibody (routine testing w rflx) - RPR - Hepatitis C antibody - Cervicovaginal ancillary only GC/Chlamydia  3. Essential (primary) hypertension BP usually well controlled. Continue current medications for now. Reassess in 6 months.   Other orders - Varicella-zoster vaccine IM (Shingrix) #2    The entirety of the information documented in the History of Present Illness, Review of Systems and Physical Exam were personally obtained by me. Portions of this information were initially documented by Idelle Jo, CMA and reviewed by me for thoroughness and accuracy.  Lelon Huh, MD  [facility]

## 2020-02-22 LAB — CERVICOVAGINAL ANCILLARY ONLY
Chlamydia: NEGATIVE
Comment: NEGATIVE
Comment: NORMAL
Neisseria Gonorrhea: NEGATIVE

## 2020-02-23 LAB — HIV ANTIBODY (ROUTINE TESTING W REFLEX): HIV Screen 4th Generation wRfx: NONREACTIVE

## 2020-02-23 LAB — HEPATITIS C ANTIBODY: Hep C Virus Ab: <0.1 s/co ratio (ref 0.0–0.9)

## 2020-02-23 LAB — RPR: RPR Ser Ql: NONREACTIVE

## 2020-02-27 ENCOUNTER — Other Ambulatory Visit: Payer: Self-pay | Admitting: Family Medicine

## 2020-02-27 DIAGNOSIS — I10 Essential (primary) hypertension: Secondary | ICD-10-CM

## 2020-02-28 ENCOUNTER — Other Ambulatory Visit: Payer: Self-pay | Admitting: Family Medicine

## 2020-02-28 DIAGNOSIS — I10 Essential (primary) hypertension: Secondary | ICD-10-CM

## 2020-02-29 ENCOUNTER — Other Ambulatory Visit: Payer: Self-pay | Admitting: Family Medicine

## 2020-03-23 ENCOUNTER — Other Ambulatory Visit: Payer: Self-pay | Admitting: Family Medicine

## 2020-03-23 DIAGNOSIS — N529 Male erectile dysfunction, unspecified: Secondary | ICD-10-CM

## 2020-03-23 NOTE — Telephone Encounter (Signed)
Requested Prescriptions  Pending Prescriptions Disp Refills  . sildenafil (VIAGRA) 50 MG tablet [Pharmacy Med Name: SILDENAFIL 50 MG TABLET] 6 tablet 4    Sig: TAKE 1 TABLET BY MOUTH DAILY AS NEEDED FOR ERECTILE DYSFUNCTION.     Urology: Erectile Dysfunction Agents Failed - 03/23/2020  4:10 PM      Failed - Last BP in normal range    BP Readings from Last 1 Encounters:  02/20/20 (!) 145/75         Passed - Valid encounter within last 12 months    Recent Outpatient Visits          1 month ago Adult situational stress disorder   [facility], Kirstie Peri, MD   3 months ago Annual physical exam   [facility] Birdie Sons, MD   1 year ago Essential (primary) hypertension   [facility] Birdie Sons, MD   1 year ago Essential (primary) hypertension   [facility] Birdie Sons, MD   1 year ago Essential (primary) hypertension   [facility], Kirstie Peri, MD      Future Appointments            In 5 months [place], Kirstie Peri, MD [facility]           . atenolol-chlorthalidone ([place]) 50-25 MG tablet [Pharmacy Med Name: ATENOLOL-CHLORTHALIDONE 50-25] 45 tablet 1    Sig: TAKE 1/2 TABLET BY MOUTH EVERY DAY     Cardiovascular: Beta Blocker + Diuretic Combos Failed - 03/23/2020  4:10 PM      Failed - Last BP in normal range    BP Readings from Last 1 Encounters:  02/20/20 (!) 145/75         Passed - K in normal range and within 180 days    Potassium  Date Value Ref Range Status  12/05/2019 4.2 3.5 - 5.2 mmol/L Final  10/14/2012 3.6 3.5 - 5.1 mmol/L Final         Passed - Na in normal range and within 180 days    Sodium  Date Value Ref Range Status  12/05/2019 139 134 - 144 mmol/L Final  10/14/2012 141 136 - 145 mmol/L Final         Passed - Cr in normal range and within 180 days    Creatinine  Date Value Ref Range Status  10/14/2012 1.02 0.60 - 1.30 mg/dL  Final   Creatinine, Ser  Date Value Ref Range Status  12/05/2019 0.93 0.76 - 1.27 mg/dL Final         Passed - Ca in normal range and within 180 days    Calcium  Date Value Ref Range Status  12/05/2019 9.7 8.7 - 10.2 mg/dL Final   Calcium, Total  Date Value Ref Range Status  10/14/2012 9.3 8.5 - 10.1 mg/dL Final         Passed - Patient is not pregnant      Passed - Last Heart Rate in normal range    Pulse Readings from Last 1 Encounters:  02/20/20 (!) 52         Passed - Valid encounter within last 6 months    Recent Outpatient Visits          1 month ago Adult situational stress disorder   [place], Kirstie Peri, MD   3 months ago Annual physical exam   [facility] Birdie Sons, MD  1 year ago Essential (primary) hypertension   [facility] Birdie Sons, MD   1 year ago Essential (primary) hypertension   [place], Kirstie Peri, MD   1 year ago Essential (primary) hypertension   [place], Kirstie Peri, MD      Future Appointments            In 5 months [place], Kirstie Peri, MD [facility], [place]           . fluconazole (DIFLUCAN) 150 MG tablet [Pharmacy Med Name: FLUCONAZOLE 150 MG TABLET] 4 tablet 3    Sig: TAKE 1 TABLET BY MOUTH WEEKLY AS NEEDED     Off-Protocol Failed - 03/23/2020  4:10 PM      Failed - Medication not assigned to a protocol, review manually.      Passed - Valid encounter within last 12 months    Recent Outpatient Visits          1 month ago Adult situational stress disorder   [facility], Kirstie Peri, MD   3 months ago Annual physical exam   [facility] Birdie Sons, MD   1 year ago Essential (primary) hypertension   [facility] Birdie Sons, MD   1 year ago Essential (primary) hypertension   [place], Kirstie Peri, MD   1 year ago Essential  (primary) hypertension   [facility], Kirstie Peri, MD      Future Appointments            In 5 months [place], Kirstie Peri, MD [facility], [facility]           . citalopram (CELEXA) 10 MG tablet [Pharmacy Med Name: CITALOPRAM HBR 10 MG TABLET] 90 tablet 3    Sig: TAKE 1 [place]     Psychiatry:  Antidepressants - SSRI Passed - 03/23/2020  4:10 PM      Passed - Valid encounter within last 6 months    Recent Outpatient Visits          1 month ago Adult situational stress disorder   [place], Kirstie Peri, MD   3 months ago Annual physical exam   [facility] Birdie Sons, MD   1 year ago Essential (primary) hypertension   [facility] Birdie Sons, MD   1 year ago Essential (primary) hypertension   [facility] Birdie Sons, MD   1 year ago Essential (primary) hypertension   [place], Kirstie Peri, MD      Future Appointments            In 5 months [place], Kirstie Peri, MD [facility], [place]

## 2020-03-23 NOTE — Telephone Encounter (Signed)
Requested Prescriptions  Pending Prescriptions Disp Refills  . fluconazole (DIFLUCAN) 150 MG tablet [Pharmacy Med Name: FLUCONAZOLE 150 MG TABLET] 4 tablet 3    Sig: TAKE 1 TABLET BY MOUTH WEEKLY AS NEEDED     Off-Protocol Failed - 03/23/2020  4:10 PM      Failed - Medication not assigned to a protocol, review manually.      Passed - Valid encounter within last 12 months    Recent Outpatient Visits          1 month ago Adult situational stress disorder   [facility], Kirstie Peri, MD   3 months ago Annual physical exam   [facility] Birdie Sons, MD   1 year ago Essential (primary) hypertension   [facility] Birdie Sons, MD   1 year ago Essential (primary) hypertension   [facility] Birdie Sons, MD   1 year ago Essential (primary) hypertension   [place], Kirstie Peri, MD      Future Appointments            In 5 months [place], Kirstie Peri, MD [facility]           Signed Prescriptions Disp Refills   sildenafil (VIAGRA) 50 MG tablet 6 tablet 4    Sig: TAKE 1 TABLET BY MOUTH DAILY AS NEEDED FOR ERECTILE DYSFUNCTION.     Urology: Erectile Dysfunction Agents Failed - 03/23/2020  4:10 PM      Failed - Last BP in normal range    BP Readings from Last 1 Encounters:  02/20/20 (!) 145/75         Passed - Valid encounter within last 12 months    Recent Outpatient Visits          1 month ago Adult situational stress disorder   [facility], Kirstie Peri, MD   3 months ago Annual physical exam   [facility] Birdie Sons, MD   1 year ago Essential (primary) hypertension   [facility] Birdie Sons, MD   1 year ago Essential (primary) hypertension   [facility] Birdie Sons, MD   1 year ago Essential (primary) hypertension   [place], Kirstie Peri, MD      Future  Appointments            In 5 months Fisher, Kirstie Peri, MD [facility]           Refused Prescriptions Disp Refills  . atenolol-chlorthalidone (TENORETIC) 50-25 MG tablet [Pharmacy Med Name: ATENOLOL-CHLORTHALIDONE 50-25] 45 tablet 1    Sig: TAKE 1/2 TABLET BY MOUTH EVERY DAY     Cardiovascular: Beta Blocker + Diuretic Combos Failed - 03/23/2020  4:10 PM      Failed - Last BP in normal range    BP Readings from Last 1 Encounters:  02/20/20 (!) 145/75         Passed - K in normal range and within 180 days    Potassium  Date Value Ref Range Status  12/05/2019 4.2 3.5 - 5.2 mmol/L Final  10/14/2012 3.6 3.5 - 5.1 mmol/L Final         Passed - Na in normal range and within 180 days    Sodium  Date Value Ref Range Status  12/05/2019 139 134 - 144 mmol/L Final  10/14/2012 141 136 - 145 mmol/L Final  Passed - Cr in normal range and within 180 days    Creatinine  Date Value Ref Range Status  10/14/2012 1.02 0.60 - 1.30 mg/dL Final   Creatinine, Ser  Date Value Ref Range Status  12/05/2019 0.93 0.76 - 1.27 mg/dL Final         Passed - Ca in normal range and within 180 days    Calcium  Date Value Ref Range Status  12/05/2019 9.7 8.7 - 10.2 mg/dL Final   Calcium, Total  Date Value Ref Range Status  10/14/2012 9.3 8.5 - 10.1 mg/dL Final         Passed - Patient is not pregnant      Passed - Last Heart Rate in normal range    Pulse Readings from Last 1 Encounters:  02/20/20 (!) 52         Passed - Valid encounter within last 6 months    Recent Outpatient Visits          1 month ago Adult situational stress disorder   [facility], Kirstie Peri, MD   3 months ago Annual physical exam   [facility] Birdie Sons, MD   1 year ago Essential (primary) hypertension   [facility], Kirstie Peri, MD   1 year ago Essential (primary) hypertension   [facility], Kirstie Peri, MD    1 year ago Essential (primary) hypertension   [facility], Kirstie Peri, MD      Future Appointments            In 5 months Fisher, Kirstie Peri, MD [facility]           . citalopram (CELEXA) 10 MG tablet [Pharmacy Med Name: CITALOPRAM HBR 10 MG TABLET] 90 tablet 3    Sig: TAKE 1 TABLET BY MOUTH EVERY DAY     Psychiatry:  Antidepressants - SSRI Passed - 03/23/2020  4:10 PM      Passed - Valid encounter within last 6 months    Recent Outpatient Visits          1 month ago Adult situational stress disorder   [place], Kirstie Peri, MD   3 months ago Annual physical exam   [facility] Birdie Sons, MD   1 year ago Essential (primary) hypertension   [facility] Birdie Sons, MD   1 year ago Essential (primary) hypertension   [facility] Birdie Sons, MD   1 year ago Essential (primary) hypertension   [place], Kirstie Peri, MD      Future Appointments            In 5 months Fisher, Kirstie Peri, MD [facility] [place]

## 2020-03-23 NOTE — Telephone Encounter (Signed)
Requested medications are due for refill today? Fluconazole is off protocol.    Requested medications are on active medication list?  Yes   Last Refill:  08/04/2019    # 4 tabs with 3 refills.    Future visit scheduled?  Yes  Notes to Clinic:  Medication is not assigned to a protocol.

## 2020-03-27 ENCOUNTER — Other Ambulatory Visit: Payer: Self-pay | Admitting: Family Medicine

## 2020-03-27 DIAGNOSIS — F4320 Adjustment disorder, unspecified: Secondary | ICD-10-CM

## 2020-03-27 NOTE — Telephone Encounter (Signed)
Copied from [place] [id number]. Topic: Quick Communication - Rx Refill/Question >> Mar 27, 2020 10:11 AM Leward Quan A wrote: Medication: citalopram (CELEXA) 20 MG tablet   Has the patient contacted their pharmacy? Yes (Agent: If no, request that the patient contact the pharmacy for the refill.) (Agent: If yes, when and what did the pharmacy advise?)  Preferred Pharmacy (with phone number or street name): [id number] - [place], [place] [address]  Phone:  [phone] Fax:  [phone]     Agent: Please be advised that RX refills may take up to 3 business days. We ask that you follow-up with your pharmacy.

## 2020-04-19 ENCOUNTER — Other Ambulatory Visit: Payer: Self-pay | Admitting: Family Medicine

## 2020-04-19 DIAGNOSIS — F4320 Adjustment disorder, unspecified: Secondary | ICD-10-CM

## 2020-06-21 ENCOUNTER — Other Ambulatory Visit: Payer: Self-pay | Admitting: Family Medicine

## 2020-08-23 ENCOUNTER — Ambulatory Visit: Payer: 59 | Admitting: Family Medicine

## 2020-08-28 ENCOUNTER — Other Ambulatory Visit: Payer: Self-pay | Admitting: Family Medicine

## 2020-08-28 DIAGNOSIS — I10 Essential (primary) hypertension: Secondary | ICD-10-CM

## 2020-09-15 ENCOUNTER — Other Ambulatory Visit: Payer: Self-pay | Admitting: Family Medicine

## 2020-09-17 ENCOUNTER — Other Ambulatory Visit: Payer: Self-pay | Admitting: Family Medicine

## 2020-09-17 DIAGNOSIS — N529 Male erectile dysfunction, unspecified: Secondary | ICD-10-CM

## 2020-09-17 NOTE — Telephone Encounter (Signed)
Requested Prescriptions  Pending Prescriptions Disp Refills  . sildenafil (VIAGRA) 50 MG tablet [Pharmacy Med Name: SILDENAFIL 50 MG TABLET] 6 tablet 4    Sig: TAKE 1 TABLET BY MOUTH DAILY AS NEEDED FOR ERECTILE DYSFUNCTION.     Urology: Erectile Dysfunction Agents Failed - 09/17/2020  5:56 PM      Failed - Last BP in normal range    BP Readings from Last 1 Encounters:  02/20/20 (!) 145/75         Passed - Valid encounter within last 12 months    Recent Outpatient Visits          7 months ago Adult situational stress disorder   [facility] [place], Kirstie Peri, MD   9 months ago Annual physical exam   [facility] Birdie Sons, MD   1 year ago Essential (primary) hypertension   [facility] Birdie Sons, MD   1 year ago Essential (primary) hypertension   [facility] Birdie Sons, MD   1 year ago Essential (primary) hypertension   [place], Kirstie Peri, MD      Future Appointments            In 1 week [place], Kirstie Peri, MD [facility], [place]

## 2020-09-26 ENCOUNTER — Other Ambulatory Visit: Payer: Self-pay | Admitting: Family Medicine

## 2020-09-30 ENCOUNTER — Ambulatory Visit (INDEPENDENT_AMBULATORY_CARE_PROVIDER_SITE_OTHER): Payer: 59 | Admitting: Family Medicine

## 2020-09-30 ENCOUNTER — Encounter: Payer: Self-pay | Admitting: Family Medicine

## 2020-09-30 ENCOUNTER — Other Ambulatory Visit: Payer: Self-pay

## 2020-09-30 VITALS — BP 144/82 | HR 53 | Temp 98.6°F | Wt 234.0 lb

## 2020-09-30 DIAGNOSIS — Z23 Encounter for immunization: Secondary | ICD-10-CM | POA: Diagnosis not present

## 2020-09-30 DIAGNOSIS — I10 Essential (primary) hypertension: Secondary | ICD-10-CM

## 2020-09-30 DIAGNOSIS — G4710 Hypersomnia, unspecified: Secondary | ICD-10-CM

## 2020-09-30 DIAGNOSIS — F4320 Adjustment disorder, unspecified: Secondary | ICD-10-CM | POA: Diagnosis not present

## 2020-09-30 NOTE — Progress Notes (Signed)
I,Roshena L Chambers,acting as a scribe for Lelon Huh, MD.,have documented all relevant documentation on the behalf of Lelon Huh, MD,as directed by  Lelon Huh, MD while in the presence of Lelon Huh, MD.   Established patient visit   Patient: Sergio King   DOB: 1961-07-13   59 y.o. Male  MRN: [id number] Visit Date: 09/30/2020  Today's healthcare provider: Lelon Huh, MD   Chief Complaint  Patient presents with  . Hypertension   Subjective    HPI  Hypertension, follow-up  BP Readings from Last 3 Encounters:  09/30/20 (!) 144/82  02/20/20 (!) 145/75  11/28/19 138/70   Wt Readings from Last 3 Encounters:  09/30/20 234 lb (106.1 kg)  02/20/20 237 lb 9.6 oz (107.8 kg)  11/28/19 233 lb (105.7 kg)     He was last seen for hypertension 6 months ago.  BP at that visit was 145/75. Management since that visit includes continue same medication.  He reports good compliance with treatment. He is not having side effects.  He is following a Regular diet. He is not exercising. He does not smoke.  Use of agents associated with hypertension: NSAIDS.   Outside blood pressures are not checked . Symptoms: No chest pain No chest pressure  No palpitations No syncope  No dyspnea No orthopnea  No paroxysmal nocturnal dyspnea No lower extremity edema   Pertinent labs: Lab Results  Component Value Date   CHOL 152 12/05/2019   HDL 43 12/05/2019   LDLCALC 80 12/05/2019   TRIG 168 (H) 12/05/2019   CHOLHDL 3.5 12/05/2019   Lab Results  Component Value Date   NA 139 12/05/2019   K 4.2 12/05/2019   CREATININE 0.93 12/05/2019   GFRNONAA 90 12/05/2019   GFRAA 104 12/05/2019   GLUCOSE 121 (H) 12/05/2019     The 10-year ASCVD risk score Mikey Bussing DC Jr., et al., 2013) is: 9.8%   ---------------------------------------------------------------------------------------------------  Follow up for Adult situational stress:  The patient was last seen for this 7  months ago. Changes made at last visit include non; continue same medication.  He reports good compliance with treatment. He feels that condition is Unchanged. He is not having side effects.   ----------------------------------------------------------------------------------------- He also states he has been waking himself up snoring, and acquaintances have told him he snores very loudly and appears to stop breathing when sleeping. He does he feel sleepy even when he first gets up in the morning. 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      Medications: Outpatient Medications Prior to Visit  Medication Sig  . amLODipine (NORVASC) 10 MG tablet TAKE 1 TABLET BY MOUTH EVERY DAY  . aspirin EC 81 MG tablet Take 81 mg by mouth daily.  Marland Kitchen atenolol-chlorthalidone (TENORETIC) 50-25 MG tablet TAKE 1/2 TABLET BY MOUTH EVERY DAY  . atorvastatin (LIPITOR) 40 MG tablet TAKE 1 TABLET BY MOUTH EVERY DAY  . benazepril (LOTENSIN) 20 MG tablet TAKE 1 TABLET BY MOUTH EVERY DAY  . BIOTIN PO Take by mouth.  . citalopram (CELEXA) 40 MG tablet Take 40 mg by mouth daily.  . Coenzyme Q10 (COQ10 PO) Take by mouth.  . fluconazole (DIFLUCAN) 150 MG tablet TAKE 1 TABLET BY MOUTH WEEKLY AS NEEDED  . GLUCOSAMINE-CHONDROITIN PO Take by mouth.  Marland Kitchen ketoconazole (NIZORAL) 2 % cream APPLY TOPICALLY DAILY AS NEEDED FOR IRRITATION. APPLY EXTERNALLY AS NEEDED  . LORazepam (ATIVAN) 0.5 MG tablet TAKE 1 TABLET BY MOUTH EVERY NIGHT AT BEDTIME  .  Multiple Vitamin (MULTIVITAMIN) tablet Take 1 tablet by mouth daily.  . Omega-3 Fatty Acids (FISH OIL PO) Take by mouth.  . sildenafil (VIAGRA) 50 MG tablet TAKE 1 TABLET BY MOUTH DAILY AS NEEDED FOR ERECTILE DYSFUNCTION.  . [DISCONTINUED] citalopram (CELEXA) 20 MG tablet TAKE 1 TABLET BY MOUTH EVERY DAY (Patient not taking: Reported on 09/30/2020)   No facility-administered medications prior to visit.    Review of Systems  Constitutional: Negative for appetite change, chills and  fever.  Respiratory: Negative for chest tightness, shortness of breath and wheezing.   Cardiovascular: Negative for chest pain and palpitations.  Gastrointestinal: Negative for abdominal pain, nausea and vomiting.      Objective    BP (!) 144/82 (BP Location: Right Arm, Patient Position: Sitting, Cuff Size: Large)   Pulse (!) 53   Temp 98.6 F (37 C) (Oral)   Wt 234 lb (106.1 kg)   BMI 31.74 kg/m    Physical Exam   General: Appearance:    Obese male in no acute distress  Eyes:    PERRL, conjunctiva/corneas clear, EOM's intact       Lungs:     Clear to auscultation bilaterally, respirations unlabored  Heart:    Bradycardic. Normal rhythm. No murmurs, rubs, or gallops.   MS:   All extremities are intact.   Neurologic:   Awake, alert, oriented x 3. No apparent focal neurological           defect.         No results found for any visits on 09/30/20.  Assessment & Plan     1. Essential (primary) hypertension BP not to goal, but suspect he has some underlying sleep apnea. Will continue current medications for now and expect improvement if he does has OSA to be treated. 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   2. Hypersomnia  - Home sleep test  3. Adult situational stress disorder He feels citalopram is working very well and wishes to continue same dosage.   4. Need for influenza vaccination  - Flu Vaccine QUAD 36+ mos IM (Fluarix/Fluzone)  REFILL - citalopram (CELEXA) 40 MG tablet; Take 40 mg by mouth daily.        The entirety of the information documented in the History of Present Illness, Review of Systems and Physical Exam were personally obtained by me. Portions of this information were initially documented by the CMA and reviewed by me for thoroughness and accuracy.      Lelon Huh, MD  [facility] [phone] (phone) [phone] (fax)  [place]

## 2020-09-30 NOTE — Patient Instructions (Signed)
. Please review the attached list of medications and notify my office if there are any errors.    DASH Eating Plan DASH stands for "Dietary Approaches to Stop Hypertension." The DASH eating plan is a healthy eating plan that has been shown to reduce high blood pressure (hypertension). It may also reduce your risk for type 2 diabetes, heart disease, and stroke. The DASH eating plan may also help with weight loss. What are tips for following this plan?  General guidelines  Avoid eating more than 2,300 mg (milligrams) of salt (sodium) a day. If you have hypertension, you may need to reduce your sodium intake to 1,500 mg a day.  Limit alcohol intake to no more than 1 drink a day for nonpregnant women and 2 drinks a day for men. One drink equals 12 oz of beer, 5 oz of wine, or 1 oz of hard liquor.  Work with your health care provider to maintain a healthy body weight or to lose weight. Ask what an ideal weight is for you.  Get at least 30 minutes of exercise that causes your heart to beat faster (aerobic exercise) most days of the week. Activities may include walking, swimming, or biking.  Work with your health care provider or diet and nutrition specialist (dietitian) to adjust your eating plan to your individual calorie needs. Reading food labels   Check food labels for the amount of sodium per serving. Choose foods with less than 5 percent of the Daily Value of sodium. Generally, foods with less than 300 mg of sodium per serving fit into this eating plan.  To find whole grains, look for the word "whole" as the first word in the ingredient list. Shopping  Buy products labeled as "low-sodium" or "no salt added."  Buy fresh foods. Avoid canned foods and premade or frozen meals. Cooking  Avoid adding salt when cooking. Use salt-free seasonings or herbs instead of table salt or sea salt. Check with your health care provider or pharmacist before using salt substitutes.  Do not fry foods.  Cook foods using healthy methods such as baking, boiling, grilling, and broiling instead.  Cook with heart-healthy oils, such as olive, canola, soybean, or sunflower oil. Meal planning  Eat a balanced diet that includes: ? 5 or more servings of fruits and vegetables each day. At each meal, try to fill half of your plate with fruits and vegetables. ? Up to 6-8 servings of whole grains each day. ? Less than 6 oz of lean meat, poultry, or fish each day. A 3-oz serving of meat is about the same size as a deck of cards. One egg equals 1 oz. ? 2 servings of low-fat dairy each day. ? A serving of nuts, seeds, or beans 5 times each week. ? Heart-healthy fats. Healthy fats called Omega-3 fatty acids are found in foods such as flaxseeds and coldwater fish, like sardines, salmon, and mackerel.  Limit how much you eat of the following: ? Canned or prepackaged foods. ? Food that is high in trans fat, such as fried foods. ? Food that is high in saturated fat, such as fatty meat. ? Sweets, desserts, sugary drinks, and other foods with added sugar. ? Full-fat dairy products.  Do not salt foods before eating.  Try to eat at least 2 vegetarian meals each week.  Eat more home-cooked food and less restaurant, buffet, and fast food.  When eating at a restaurant, ask that your food be prepared with less salt or no salt,  if possible. What foods are recommended? The items listed may not be a complete list. Talk with your dietitian about what dietary choices are best for you. Grains Whole-grain or whole-wheat bread. Whole-grain or whole-wheat pasta. Brown rice. [place]. Bulgur. Whole-grain and low-sodium cereals. Pita bread. Low-fat, low-sodium crackers. Whole-wheat flour tortillas. Vegetables Fresh or frozen vegetables (raw, steamed, roasted, or grilled). Low-sodium or reduced-sodium tomato and vegetable juice. Low-sodium or reduced-sodium tomato sauce and tomato paste. Low-sodium or reduced-sodium  canned vegetables. Fruits All fresh, dried, or frozen fruit. Canned fruit in natural juice (without added sugar). Meat and other protein foods Skinless chicken or [place]. Ground chicken or [place]. Pork with fat trimmed off. Fish and seafood. Egg whites. Dried beans, peas, or lentils. Unsalted nuts, nut butters, and seeds. Unsalted canned beans. Lean cuts of beef with fat trimmed off. Low-sodium, lean deli meat. Dairy Low-fat (1%) or fat-free (skim) milk. Fat-free, low-fat, or reduced-fat cheeses. Nonfat, low-sodium ricotta or cottage cheese. Low-fat or nonfat yogurt. Low-fat, low-sodium cheese. Fats and oils Soft margarine without trans fats. Vegetable oil. Low-fat, reduced-fat, or light mayonnaise and salad dressings (reduced-sodium). Canola, safflower, olive, soybean, and sunflower oils. Avocado. Seasoning and other foods Herbs. Spices. Seasoning mixes without salt. Unsalted popcorn and pretzels. Fat-free sweets. What foods are not recommended? The items listed may not be a complete list. Talk with your dietitian about what dietary choices are best for you. Grains Baked goods made with fat, such as croissants, muffins, or some breads. Dry pasta or rice meal packs. Vegetables Creamed or fried vegetables. Vegetables in a cheese sauce. Regular canned vegetables (not low-sodium or reduced-sodium). Regular canned tomato sauce and paste (not low-sodium or reduced-sodium). Regular tomato and vegetable juice (not low-sodium or reduced-sodium). Angie Fava. Olives. Fruits Canned fruit in a light or heavy syrup. Fried fruit. Fruit in cream or butter sauce. Meat and other protein foods Fatty cuts of meat. Ribs. Fried meat. Berniece Salines. Sausage. Bologna and other processed lunch meats. Salami. Fatback. Hotdogs. Bratwurst. Salted nuts and seeds. Canned beans with added salt. Canned or smoked fish. Whole eggs or egg yolks. Chicken or [place] with skin. Dairy Whole or 2% milk, cream, and half-and-half. Whole or  full-fat cream cheese. Whole-fat or sweetened yogurt. Full-fat cheese. Nondairy creamers. Whipped toppings. Processed cheese and cheese spreads. Fats and oils Butter. Stick margarine. Lard. Shortening. Ghee. Bacon fat. Tropical oils, such as coconut, palm kernel, or palm oil. Seasoning and other foods Salted popcorn and pretzels. Onion salt, garlic salt, seasoned salt, table salt, and sea salt. Worcestershire sauce. Tartar sauce. Barbecue sauce. Teriyaki sauce. Soy sauce, including reduced-sodium. Steak sauce. Canned and packaged gravies. Fish sauce. Oyster sauce. Cocktail sauce. Horseradish that you find on the shelf. Ketchup. Mustard. Meat flavorings and tenderizers. Bouillon cubes. Hot sauce and Tabasco sauce. Premade or packaged marinades. Premade or packaged taco seasonings. Relishes. Regular salad dressings. Where to find more information:  National Heart, Lung, and [place]: https://wilson-eaton.com/  American Heart Association: www.heart.org Summary  The DASH eating plan is a healthy eating plan that has been shown to reduce high blood pressure (hypertension). It may also reduce your risk for type 2 diabetes, heart disease, and stroke.  With the DASH eating plan, you should limit salt (sodium) intake to 2,300 mg a day. If you have hypertension, you may need to reduce your sodium intake to 1,500 mg a day.  When on the DASH eating plan, aim to eat more fresh fruits and vegetables, whole grains, lean proteins, low-fat dairy, and heart-healthy fats.  Work with your health care provider or diet and nutrition specialist (dietitian) to adjust your eating plan to your individual calorie needs. This information is not intended to replace advice given to you by your health care provider. Make sure you discuss any questions you have with your health care provider. Document Revised: 11/26/2017 Document Reviewed: 12/07/2016 Elsevier Patient Education  2020 [employer].

## 2020-10-03 ENCOUNTER — Other Ambulatory Visit: Payer: Self-pay | Admitting: Family Medicine

## 2020-11-19 ENCOUNTER — Encounter: Payer: Self-pay | Admitting: Family Medicine

## 2020-11-19 ENCOUNTER — Telehealth: Payer: Self-pay | Admitting: Family Medicine

## 2020-11-19 DIAGNOSIS — G4733 Obstructive sleep apnea (adult) (pediatric): Secondary | ICD-10-CM | POA: Insufficient documentation

## 2020-11-19 NOTE — Telephone Encounter (Signed)
Patient returned call- notified of sleep study results- patient wants to know if the CPAP comes to his home- and is someone going to show him how to use it? Told him I was not sure and would have office call him back.

## 2020-11-19 NOTE — Telephone Encounter (Signed)
Please advise sleep study shows severe sleep apnea and need trial of CPAP. Have sent order for home CPAP. He will need to schedule 1 follow up 1 month after getting CPAP machine

## 2020-11-19 NOTE — Telephone Encounter (Signed)
LMOVM for pt to return call. Okay for [facility] triage to give patient results.  

## 2020-11-19 NOTE — Telephone Encounter (Signed)
Patient advised company will show him how to use CPAP.

## 2020-11-20 ENCOUNTER — Other Ambulatory Visit: Payer: Self-pay | Admitting: Family Medicine

## 2020-11-20 DIAGNOSIS — I10 Essential (primary) hypertension: Secondary | ICD-10-CM

## 2020-11-20 NOTE — Telephone Encounter (Signed)
Requested medication (s) are due for refill today:Due 11/27/20  Requested medication (s) are on the active medication list: yes  Last refill: 08/28/20  #90  0 refills  Future visit scheduled no  Notes to clinic  failed due to labs ; last labs 12/05/2019 ,please review  Requested Prescriptions  Pending Prescriptions Disp Refills   benazepril (LOTENSIN) 20 MG tablet [Pharmacy Med Name: BENAZEPRIL HCL 20 MG TABLET] 90 tablet 0    Sig: TAKE 1 TABLET BY MOUTH EVERY DAY      Cardiovascular:  ACE Inhibitors Failed - 11/20/2020  1:35 AM      Failed - Cr in normal range and within 180 days    Creatinine  Date Value Ref Range Status  10/14/2012 1.02 0.60 - 1.30 mg/dL Final   Creatinine, Ser  Date Value Ref Range Status  12/05/2019 0.93 0.76 - 1.27 mg/dL Final          Failed - K in normal range and within 180 days    Potassium  Date Value Ref Range Status  12/05/2019 4.2 3.5 - 5.2 mmol/L Final  10/14/2012 3.6 3.5 - 5.1 mmol/L Final          Failed - Last BP in normal range    BP Readings from Last 1 Encounters:  09/30/20 (!) 144/82          Passed - Patient is not pregnant      Passed - Valid encounter within last 6 months    Recent Outpatient Visits           1 month ago Essential (primary) hypertension   [facility] Birdie Sons, MD   9 months ago Adult situational stress disorder   [facility] Birdie Sons, MD   11 months ago Annual physical exam   [facility] Birdie Sons, MD   1 year ago Essential (primary) hypertension   [place], Kirstie Peri, MD   2 years ago Essential (primary) hypertension   [place], Kirstie Peri, MD

## 2020-12-14 ENCOUNTER — Other Ambulatory Visit: Payer: Self-pay | Admitting: Family Medicine

## 2020-12-30 ENCOUNTER — Other Ambulatory Visit: Payer: Self-pay | Admitting: Family Medicine

## 2021-01-25 ENCOUNTER — Other Ambulatory Visit: Payer: Self-pay | Admitting: Family Medicine

## 2021-01-25 NOTE — Telephone Encounter (Signed)
Requested medication (s) are due for refill today: yes  Requested medication (s) are on the active medication list: yes  Last refill:  10/03/20  Future visit scheduled: no  Notes to clinic:  med not assigned to a protocol   Requested Prescriptions  Pending Prescriptions Disp Refills   fluconazole (DIFLUCAN) 150 MG tablet [Pharmacy Med Name: FLUCONAZOLE 150 MG TABLET] 4 tablet 3    Sig: TAKE 1 TABLET BY MOUTH WEEKLY AS NEEDED      Off-Protocol Failed - 01/25/2021  8:34 AM      Failed - Medication not assigned to a protocol, review manually.      Passed - Valid encounter within last 12 months    Recent Outpatient Visits           3 months ago Essential (primary) hypertension   [facility] Birdie Sons, MD   11 months ago Adult situational stress disorder   [place], Kirstie Peri, MD   1 year ago Annual physical exam   [facility] Birdie Sons, MD   1 year ago Essential (primary) hypertension   [facility] Birdie Sons, MD   2 years ago Essential (primary) hypertension   [facility] Fisher, Kirstie Peri, MD

## 2021-02-15 ENCOUNTER — Other Ambulatory Visit: Payer: Self-pay | Admitting: Family Medicine

## 2021-02-15 DIAGNOSIS — N529 Male erectile dysfunction, unspecified: Secondary | ICD-10-CM

## 2021-02-26 ENCOUNTER — Other Ambulatory Visit: Payer: Self-pay | Admitting: Family Medicine

## 2021-02-26 DIAGNOSIS — I10 Essential (primary) hypertension: Secondary | ICD-10-CM

## 2021-03-30 ENCOUNTER — Other Ambulatory Visit: Payer: Self-pay | Admitting: Family Medicine

## 2021-03-30 NOTE — Telephone Encounter (Signed)
Requested Prescriptions  Pending Prescriptions Disp Refills  . atenolol-chlorthalidone (TENORETIC) 50-25 MG tablet [Pharmacy Med Name: ATENOLOL-CHLORTHALIDONE 50-25] 45 tablet 0    Sig: TAKE 1/2 TABLET BY MOUTH EVERY DAY     Cardiovascular: Beta Blocker + Diuretic Combos Failed - 03/30/2021 12:45 PM      Failed - K in normal range and within 180 days    Potassium  Date Value Ref Range Status  12/05/2019 4.2 3.5 - 5.2 mmol/L Final  10/14/2012 3.6 3.5 - 5.1 mmol/L Final         Failed - Na in normal range and within 180 days    Sodium  Date Value Ref Range Status  12/05/2019 139 134 - 144 mmol/L Final  10/14/2012 141 136 - 145 mmol/L Final         Failed - Cr in normal range and within 180 days    Creatinine  Date Value Ref Range Status  10/14/2012 1.02 0.60 - 1.30 mg/dL Final   Creatinine, Ser  Date Value Ref Range Status  12/05/2019 0.93 0.76 - 1.27 mg/dL Final         Failed - Ca in normal range and within 180 days    Calcium  Date Value Ref Range Status  12/05/2019 9.7 8.7 - 10.2 mg/dL Final   Calcium, Total  Date Value Ref Range Status  10/14/2012 9.3 8.5 - 10.1 mg/dL Final         Failed - Last BP in normal range    BP Readings from Last 1 Encounters:  09/30/20 (!) 144/82         Failed - Valid encounter within last 6 months    Recent Outpatient Visits          6 months ago Essential (primary) hypertension   [facility] Birdie Sons, MD   1 year ago Adult situational stress disorder   [facility] Birdie Sons, MD   1 year ago Annual physical exam   [facility] Birdie Sons, MD   2 years ago Essential (primary) hypertension   [facility] Birdie Sons, MD   2 years ago Essential (primary) hypertension   [place] Birdie Sons, MD             Passed - Patient is not pregnant      Passed - Last Heart Rate in normal range    Pulse Readings from Last 1  Encounters:  09/30/20 (!) 53

## 2021-04-22 ENCOUNTER — Other Ambulatory Visit: Payer: Self-pay | Admitting: Family Medicine

## 2021-04-22 NOTE — Telephone Encounter (Signed)
Requested medication (s) are due for refill today: no  Requested medication (s) are on the active medication list: yes  Last refill:  03/30/2021  Future visit scheduled: no  Notes to clinic:  overdue for follow up appointment   Requested Prescriptions  Pending Prescriptions Disp Refills   atenolol-chlorthalidone (TENORETIC) 50-25 MG tablet [Pharmacy Med Name: ATENOLOL-CHLORTHALIDONE 50-25] 15 tablet 0    Sig: TAKE 1/2 TABLET BY MOUTH EVERY DAY      Cardiovascular: Beta Blocker + Diuretic Combos Failed - 04/22/2021 10:36 AM      Failed - K in normal range and within 180 days    Potassium  Date Value Ref Range Status  12/05/2019 4.2 3.5 - 5.2 mmol/L Final  10/14/2012 3.6 3.5 - 5.1 mmol/L Final          Failed - Na in normal range and within 180 days    Sodium  Date Value Ref Range Status  12/05/2019 139 134 - 144 mmol/L Final  10/14/2012 141 136 - 145 mmol/L Final          Failed - Cr in normal range and within 180 days    Creatinine  Date Value Ref Range Status  10/14/2012 1.02 0.60 - 1.30 mg/dL Final   Creatinine, Ser  Date Value Ref Range Status  12/05/2019 0.93 0.76 - 1.27 mg/dL Final          Failed - Ca in normal range and within 180 days    Calcium  Date Value Ref Range Status  12/05/2019 9.7 8.7 - 10.2 mg/dL Final   Calcium, Total  Date Value Ref Range Status  10/14/2012 9.3 8.5 - 10.1 mg/dL Final          Failed - Last BP in normal range    BP Readings from Last 1 Encounters:  09/30/20 (!) 144/82          Failed - Valid encounter within last 6 months    Recent Outpatient Visits           6 months ago Essential (primary) hypertension   [facility] Birdie Sons, MD   1 year ago Adult situational stress disorder   [facility] Birdie Sons, MD   1 year ago Annual physical exam   [facility] Birdie Sons, MD   2 years ago Essential (primary) hypertension   [facility]  Birdie Sons, MD   2 years ago Essential (primary) hypertension   [facility] Birdie Sons, MD                Passed - Patient is not pregnant      Passed - Last Heart Rate in normal range    Pulse Readings from Last 1 Encounters:  09/30/20 (!) 53

## 2021-05-16 ENCOUNTER — Other Ambulatory Visit: Payer: Self-pay | Admitting: Family Medicine

## 2021-05-16 DIAGNOSIS — I10 Essential (primary) hypertension: Secondary | ICD-10-CM

## 2021-06-02 ENCOUNTER — Other Ambulatory Visit: Payer: Self-pay | Admitting: Family Medicine

## 2021-06-02 DIAGNOSIS — I10 Essential (primary) hypertension: Secondary | ICD-10-CM

## 2021-06-10 ENCOUNTER — Other Ambulatory Visit: Payer: Self-pay | Admitting: Family Medicine

## 2021-06-10 DIAGNOSIS — I10 Essential (primary) hypertension: Secondary | ICD-10-CM

## 2021-06-10 NOTE — Telephone Encounter (Signed)
Requested medications are due for refill today yes  Requested medications are on the active medication list  yes (filled 5/20)  Last refill 05/16/21  Last visit 09/2020  Future visit scheduled no  Notes to clinic Has already had a curtesy refill and there is no upcoming appointment scheduled.

## 2021-06-11 ENCOUNTER — Other Ambulatory Visit: Payer: Self-pay | Admitting: Family Medicine

## 2021-06-11 DIAGNOSIS — I10 Essential (primary) hypertension: Secondary | ICD-10-CM

## 2021-06-30 ENCOUNTER — Other Ambulatory Visit: Payer: Self-pay | Admitting: Family Medicine

## 2021-06-30 DIAGNOSIS — N529 Male erectile dysfunction, unspecified: Secondary | ICD-10-CM

## 2021-07-01 NOTE — Telephone Encounter (Signed)
   Notes to clinic:  Patient las seen on 09/30/2020 No follow up listed  Review for refill    Requested Prescriptions  Pending Prescriptions Disp Refills   sildenafil (VIAGRA) 50 MG tablet [Pharmacy Med Name: SILDENAFIL 50 MG TABLET] 6 tablet 4    Sig: TAKE 1 TABLET BY MOUTH DAILY AS NEEDED FOR ERECTILE DYSFUNCTION.      Urology: Erectile Dysfunction Agents Failed - 06/30/2021  1:33 AM      Failed - Last BP in normal range    BP Readings from Last 1 Encounters:  09/30/20 (!) 144/82          Passed - Valid encounter within last 12 months    Recent Outpatient Visits           9 months ago Essential (primary) hypertension   [facility] Birdie Sons, MD   1 year ago Adult situational stress disorder   [place], Kirstie Peri, MD   1 year ago Annual physical exam   [facility] Birdie Sons, MD   2 years ago Essential (primary) hypertension   [facility] Birdie Sons, MD   2 years ago Essential (primary) hypertension   [facility] Fisher, Kirstie Peri, MD

## 2021-07-08 ENCOUNTER — Other Ambulatory Visit: Payer: Self-pay | Admitting: Family Medicine

## 2021-07-08 DIAGNOSIS — I10 Essential (primary) hypertension: Secondary | ICD-10-CM

## 2021-07-08 NOTE — Telephone Encounter (Signed)
Copied from [place] [phone]. Topic: Quick Communication - Rx Refill/Question >> Jul 08, 2021  4:07 PM Tessa Lerner A wrote: Medication: atenolol-chlorthalidone (TENORETIC) 50-25 MG tablet   Has the patient contacted their pharmacy? Yes.   (Agent: If no, request that the patient contact the pharmacy for the refill.) (Agent: If yes, when and what did the pharmacy advise?)  Preferred Pharmacy (with phone number or street name): [id number] - [place], [address]  Phone:  [phone] Fax:  [phone]  Agent: Please be advised that RX refills may take up to 3 business days. We ask that you follow-up with your pharmacy.

## 2021-07-08 NOTE — Telephone Encounter (Signed)
Requested medication (s) are due for refill today: yes  Requested medication (s) are on the active medication list: yes  Last refill:  05/16/21 #15  Future visit scheduled: yes 08/15/21  Notes to clinic:  overdue lab work. Pt has upcoming appt and needs enough refill to last until then   Requested Prescriptions  Pending Prescriptions Disp Refills   atenolol-chlorthalidone (TENORETIC) 50-25 MG tablet 15 tablet 0    Sig: Take 0.5 tablets by mouth daily.      Cardiovascular: Beta Blocker + Diuretic Combos Failed - 07/08/2021  4:15 PM      Failed - K in normal range and within 180 days    Potassium  Date Value Ref Range Status  12/05/2019 4.2 3.5 - 5.2 mmol/L Final  10/14/2012 3.6 3.5 - 5.1 mmol/L Final          Failed - Na in normal range and within 180 days    Sodium  Date Value Ref Range Status  12/05/2019 139 134 - 144 mmol/L Final  10/14/2012 141 136 - 145 mmol/L Final          Failed - Cr in normal range and within 180 days    Creatinine  Date Value Ref Range Status  10/14/2012 1.02 0.60 - 1.30 mg/dL Final   Creatinine, Ser  Date Value Ref Range Status  12/05/2019 0.93 0.76 - 1.27 mg/dL Final          Failed - Ca in normal range and within 180 days    Calcium  Date Value Ref Range Status  12/05/2019 9.7 8.7 - 10.2 mg/dL Final   Calcium, Total  Date Value Ref Range Status  10/14/2012 9.3 8.5 - 10.1 mg/dL Final          Failed - Last BP in normal range    BP Readings from Last 1 Encounters:  09/30/20 (!) 144/82          Failed - Valid encounter within last 6 months    Recent Outpatient Visits           9 months ago Essential (primary) hypertension   [place] Birdie Sons, MD   1 year ago Adult situational stress disorder   [facility] Birdie Sons, MD   1 year ago Annual physical exam   [facility] Birdie Sons, MD   2 years ago Essential (primary) hypertension   [facility] Birdie Sons, MD   2 years ago Essential (primary) hypertension   [place], Kirstie Peri, MD       Future Appointments             In 1 month [place], Kirstie Peri, MD [facility] [place] - Patient is not pregnant      Passed - Last Heart Rate in normal range    Pulse Readings from Last 1 Encounters:  09/30/20 (!) 53

## 2021-07-09 MED ORDER — ATENOLOL-CHLORTHALIDONE 50-25 MG PO TABS: 0.5000 | ORAL_TABLET | Freq: Every day | ORAL | 0 refills | Status: DC

## 2021-07-17 ENCOUNTER — Other Ambulatory Visit: Payer: Self-pay | Admitting: Family Medicine

## 2021-07-17 DIAGNOSIS — F4320 Adjustment disorder, unspecified: Secondary | ICD-10-CM

## 2021-07-17 NOTE — Telephone Encounter (Signed)
Requested medications are due for refill today.  yes  Requested medications are on the active medications list.  yes  Last refill. unknown  Future visit scheduled.   yes  Notes to clinic.  Per med list 20 mg is a historical med.  Per note in chart from 09/2020 pt taking 40mg . Please advise.

## 2021-07-31 ENCOUNTER — Other Ambulatory Visit: Payer: Self-pay | Admitting: Family Medicine

## 2021-07-31 DIAGNOSIS — N529 Male erectile dysfunction, unspecified: Secondary | ICD-10-CM

## 2021-07-31 NOTE — Telephone Encounter (Signed)
Requested Prescriptions  Pending Prescriptions Disp Refills  . sildenafil (VIAGRA) 50 MG tablet [Pharmacy Med Name: SILDENAFIL 50 MG TABLET] 6 tablet 1    Sig: TAKE 1 TABLET BY MOUTH DAILY AS NEEDED FOR ERECTILE DYSFUNCTION.     Urology: Erectile Dysfunction Agents Failed - 07/31/2021  1:39 AM      Failed - Last BP in normal range    BP Readings from Last 1 Encounters:  09/30/20 (!) 144/82         Passed - Valid encounter within last 12 months    Recent Outpatient Visits          10 months ago Essential (primary) hypertension   [facility] [place], Kirstie Peri, MD   1 year ago Adult situational stress disorder   [place], Kirstie Peri, MD   1 year ago Annual physical exam   [facility] Birdie Sons, MD   2 years ago Essential (primary) hypertension   [facility] Birdie Sons, MD   2 years ago Essential (primary) hypertension   [place], Kirstie Peri, MD      Future Appointments            In 2 weeks Fisher, Kirstie Peri, MD [facility], [place]

## 2021-08-11 ENCOUNTER — Ambulatory Visit (INDEPENDENT_AMBULATORY_CARE_PROVIDER_SITE_OTHER): Payer: 59 | Admitting: Family Medicine

## 2021-08-11 ENCOUNTER — Other Ambulatory Visit: Payer: Self-pay

## 2021-08-11 ENCOUNTER — Encounter: Payer: Self-pay | Admitting: Family Medicine

## 2021-08-11 VITALS — BP 142/72 | HR 58 | Temp 98.6°F | Resp 16 | Ht 72.0 in | Wt 236.0 lb

## 2021-08-11 DIAGNOSIS — I10 Essential (primary) hypertension: Secondary | ICD-10-CM | POA: Diagnosis not present

## 2021-08-11 DIAGNOSIS — R739 Hyperglycemia, unspecified: Secondary | ICD-10-CM

## 2021-08-11 DIAGNOSIS — E781 Pure hyperglyceridemia: Secondary | ICD-10-CM

## 2021-08-11 NOTE — Progress Notes (Signed)
Established patient visit   Patient: Sergio King   DOB: Jan 15, 1961   60 y.o. Male  MRN: [id number] Visit Date: 08/11/2021  Today's healthcare provider: Lelon Huh, MD   Chief Complaint  Patient presents with   Hypertension   Subjective    HPI  Hypertension, follow-up  BP Readings from Last 3 Encounters:  08/11/21 (!) 142/72  09/30/20 (!) 144/82  02/20/20 (!) 145/75   Wt Readings from Last 3 Encounters:  08/11/21 236 lb (107 kg)  09/30/20 234 lb (106.1 kg)  02/20/20 237 lb 9.6 oz (107.8 kg)     He was last seen for hypertension 10 months ago.  BP at that visit was 144/82. Management since that visit includes no medication changes.  He reports good compliance with treatment. He is not having side effects.  He is following a Regular diet. He is not exercising. He does not smoke.  Use of agents associated with hypertension: none.   Outside blood pressures are not being checked. Symptoms: No chest pain No chest pressure  No palpitations No syncope  No dyspnea No orthopnea  No paroxysmal nocturnal dyspnea No lower extremity edema   Pertinent labs: Lab Results  Component Value Date   CHOL 152 12/05/2019   HDL 43 12/05/2019   LDLCALC 80 12/05/2019   TRIG 168 (H) 12/05/2019   CHOLHDL 3.5 12/05/2019   Lab Results  Component Value Date   NA 139 12/05/2019   K 4.2 12/05/2019   CREATININE 0.93 12/05/2019   GFRNONAA 90 12/05/2019   GFRAA 104 12/05/2019   GLUCOSE 121 (H) 12/05/2019     The 10-year ASCVD risk score Mikey Bussing DC Jr., et al., 2013) is: 10.4%     Medications: Outpatient Medications Prior to Visit  Medication Sig   amLODipine (NORVASC) 10 MG tablet Take 1 tablet (10 mg total) by mouth daily.   aspirin EC 81 MG tablet Take 81 mg by mouth daily.   atenolol-chlorthalidone (TENORETIC) 50-25 MG tablet Take 0.5 tablets by mouth daily.   atorvastatin (LIPITOR) 40 MG tablet TAKE 1 TABLET BY MOUTH EVERY DAY   benazepril (LOTENSIN) 20 MG tablet  TAKE 1 TABLET BY MOUTH EVERY DAY   BIOTIN PO Take by mouth.   citalopram (CELEXA) 20 MG tablet TAKE 1 TABLET BY MOUTH EVERY DAY   Coenzyme Q10 (COQ10 PO) Take by mouth.   fluconazole (DIFLUCAN) 150 MG tablet TAKE 1 TABLET BY MOUTH WEEKLY AS NEEDED   GLUCOSAMINE-CHONDROITIN PO Take by mouth.   ketoconazole (NIZORAL) 2 % cream APPLY TOPICALLY DAILY AS NEEDED FOR IRRITATION. APPLY EXTERNALLY AS NEEDED   Multiple Vitamin (MULTIVITAMIN) tablet Take 1 tablet by mouth daily.   Omega-3 Fatty Acids (FISH OIL PO) Take by mouth.   sildenafil (VIAGRA) 50 MG tablet TAKE 1 TABLET BY MOUTH DAILY AS NEEDED FOR ERECTILE DYSFUNCTION.   LORazepam (ATIVAN) 0.5 MG tablet TAKE 1 TABLET BY MOUTH EVERY NIGHT AT BEDTIME (Patient not taking: Reported on 08/11/2021)   No facility-administered medications prior to visit.    Review of Systems  Constitutional:  Negative for activity change and fatigue.  Respiratory:  Negative for cough and shortness of breath.   Cardiovascular:  Negative for chest pain, palpitations and leg swelling.  Musculoskeletal:  Negative for arthralgias and joint swelling.  Neurological:  Negative for dizziness, light-headedness and headaches.      Objective    BP (!) 142/72   Pulse (!) 58   Temp 98.6 F (37 C)  Resp 16   Ht 6' (1.829 m)   Wt 236 lb (107 kg)   BMI 32.01 kg/m     Physical Exam   General appearance: Mildly obese male, cooperative and in no acute distress Head: Normocephalic, without obvious abnormality, atraumatic Respiratory: Respirations even and unlabored, normal respiratory rate Extremities: All extremities are intact.  Skin: Skin color, texture, turgor normal. No rashes seen  Psych: Appropriate mood and affect. Neurologic: Mental status: Alert, oriented to person, place, and time, thought content appropriate.    Assessment & Plan     1. Essential (primary) hypertension Goal is SBP under 130. 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 Consider adding  additional hctz if electrolytes are stable.   2. Hyperglycemia  - Hemoglobin A1c  3. Hyperglyceridemia, pure He is tolerating atorvastatin well with no adverse effects.   - CBC - Comprehensive metabolic panel - Lipid panel      The entirety of the information documented in the History of Present Illness, Review of Systems and Physical Exam were personally obtained by me. Portions of this information were initially documented by the CMA and reviewed by me for thoroughness and accuracy.     Lelon Huh, MD  [facility] [phone] (phone) [phone] (fax)  [place]

## 2021-08-12 LAB — HEMOGLOBIN A1C
Est. average glucose Bld gHb Est-mCnc: 120 mg/dL
Hgb A1c MFr Bld: 5.8 % — ABNORMAL HIGH (ref 4.8–5.6)

## 2021-08-12 LAB — COMPREHENSIVE METABOLIC PANEL
ALT: 43 IU/L (ref 0–44)
AST: 30 IU/L (ref 0–40)
Albumin/Globulin Ratio: 2.7 — ABNORMAL HIGH (ref 1.2–2.2)
Albumin: 4.9 g/dL (ref 3.8–4.9)
Alkaline Phosphatase: 75 IU/L (ref 44–121)
BUN/Creatinine Ratio: 11 (ref 10–24)
BUN: 11 mg/dL (ref 8–27)
Bilirubin Total: 0.7 mg/dL (ref 0.0–1.2)
CO2: 26 mmol/L (ref 20–29)
Calcium: 9.7 mg/dL (ref 8.6–10.2)
Chloride: 102 mmol/L (ref 96–106)
Creatinine, Ser: 1.04 mg/dL (ref 0.76–1.27)
Globulin, Total: 1.8 g/dL (ref 1.5–4.5)
Glucose: 122 mg/dL — ABNORMAL HIGH (ref 65–99)
Potassium: 4.1 mmol/L (ref 3.5–5.2)
Sodium: 141 mmol/L (ref 134–144)
Total Protein: 6.7 g/dL (ref 6.0–8.5)
eGFR: 82 mL/min/{1.73_m2} (ref >59)

## 2021-08-12 LAB — CBC
Hematocrit: 46.7 % (ref 37.5–51.0)
Hemoglobin: 15.5 g/dL (ref 13.0–17.7)
MCH: 29.3 pg (ref 26.6–33.0)
MCHC: 33.2 g/dL (ref 31.5–35.7)
MCV: 88 fL (ref 79–97)
Platelets: 272 10*3/uL (ref 150–450)
RBC: 5.29 x10E6/uL (ref 4.14–5.80)
RDW: 13 % (ref 11.6–15.4)
WBC: 5.2 10*3/uL (ref 3.4–10.8)

## 2021-08-12 LAB — LIPID PANEL
Chol/HDL Ratio: 3.5 ratio (ref 0.0–5.0)
Cholesterol, Total: 149 mg/dL (ref 100–199)
HDL: 43 mg/dL (ref >39)
LDL Chol Calc (NIH): 74 mg/dL (ref 0–99)
Triglycerides: 188 mg/dL — ABNORMAL HIGH (ref 0–149)
VLDL Cholesterol Cal: 32 mg/dL (ref 5–40)

## 2021-08-15 ENCOUNTER — Ambulatory Visit: Payer: Self-pay | Admitting: Family Medicine

## 2021-08-28 ENCOUNTER — Other Ambulatory Visit: Payer: Self-pay | Admitting: Family Medicine

## 2021-08-28 DIAGNOSIS — I10 Essential (primary) hypertension: Secondary | ICD-10-CM

## 2021-08-28 MED ORDER — BENAZEPRIL-HYDROCHLOROTHIAZIDE 20-12.5 MG PO TABS: 1.0000 | ORAL_TABLET | Freq: Every day | ORAL | 1 refills | Status: DC

## 2021-09-05 ENCOUNTER — Other Ambulatory Visit: Payer: Self-pay | Admitting: Family Medicine

## 2021-09-05 DIAGNOSIS — I10 Essential (primary) hypertension: Secondary | ICD-10-CM

## 2021-09-20 ENCOUNTER — Other Ambulatory Visit: Payer: Self-pay | Admitting: Family Medicine

## 2021-09-20 DIAGNOSIS — I10 Essential (primary) hypertension: Secondary | ICD-10-CM

## 2021-09-20 NOTE — Telephone Encounter (Signed)
Tenoretic approved per protocol for 90 days. Future OV 11/12/21

## 2021-09-20 NOTE — Telephone Encounter (Signed)
Requested medication (s) are due for refill today Yes  Requested medication (s) are on the active medication list Yes  Future visit scheduled  Yes on 11/12/21  Note to clinic-Prescription expired-last ordered on 09/15/20.  LOV 08/11/21 Routing to physician for updated prescription.   Requested Prescriptions  Pending Prescriptions Disp Refills   atorvastatin (LIPITOR) 40 MG tablet [Pharmacy Med Name: ATORVASTATIN 40 MG TABLET] 90 tablet 4    Sig: TAKE 1 TABLET BY MOUTH EVERY DAY     Cardiovascular:  Antilipid - Statins Failed - 09/20/2021 11:16 AM      Failed - Triglycerides in normal range and within 360 days    Triglycerides  Date Value Ref Range Status  08/11/2021 188 (H) 0 - 149 mg/dL Final          Passed - Total Cholesterol in normal range and within 360 days    Cholesterol, Total  Date Value Ref Range Status  08/11/2021 149 100 - 199 mg/dL Final          Passed - LDL in normal range and within 360 days    LDL Chol Calc (NIH)  Date Value Ref Range Status  08/11/2021 74 0 - 99 mg/dL Final          Passed - HDL in normal range and within 360 days    HDL  Date Value Ref Range Status  08/11/2021 43 >39 mg/dL Final          Passed - Patient is not pregnant      Passed - Valid encounter within last 12 months    Recent Outpatient Visits           1 month ago Essential (primary) hypertension   [facility], Kirstie Peri, MD   11 months ago Essential (primary) hypertension   [facility], Kirstie Peri, MD   1 year ago Adult situational stress disorder   [facility], Kirstie Peri, MD   1 year ago Annual physical exam   [facility] Birdie Sons, MD   2 years ago Essential (primary) hypertension   [facility], Kirstie Peri, MD       Future Appointments             In 1 month [place], Kirstie Peri, MD [facility]            Signed Prescriptions Disp  Refills   atenolol-chlorthalidone (TENORETIC) 50-25 MG tablet 45 tablet 0    Sig: TAKE 1/2 TABLET BY MOUTH EVERY DAY     Cardiovascular: Beta Blocker + Diuretic Combos Failed - 09/20/2021 11:16 AM      Failed - Last BP in normal range    BP Readings from Last 1 Encounters:  08/11/21 (!) 142/72          Passed - K in normal range and within 180 days    Potassium  Date Value Ref Range Status  08/11/2021 4.1 3.5 - 5.2 mmol/L Final  10/14/2012 3.6 3.5 - 5.1 mmol/L Final          Passed - Na in normal range and within 180 days    Sodium  Date Value Ref Range Status  08/11/2021 141 134 - 144 mmol/L Final  10/14/2012 141 136 - 145 mmol/L Final          Passed - Cr in normal range and within 180 days    Creatinine  Date Value Ref Range Status  10/14/2012 1.02 0.60 - 1.30 mg/dL Final   Creatinine, Ser  Date Value Ref Range Status  08/11/2021 1.04 0.76 - 1.27 mg/dL Final          Passed - Ca in normal range and within 180 days    Calcium  Date Value Ref Range Status  08/11/2021 9.7 8.6 - 10.2 mg/dL Final   Calcium, Total  Date Value Ref Range Status  10/14/2012 9.3 8.5 - 10.1 mg/dL Final          Passed - Patient is not pregnant      Passed - Last Heart Rate in normal range    Pulse Readings from Last 1 Encounters:  08/11/21 (!) 49          Passed - Valid encounter within last 6 months    Recent Outpatient Visits           1 month ago Essential (primary) hypertension   [facility] [place], Kirstie Peri, MD   11 months ago Essential (primary) hypertension   [facility], Kirstie Peri, MD   1 year ago Adult situational stress disorder   [place], Kirstie Peri, MD   1 year ago Annual physical exam   [facility] Birdie Sons, MD   2 years ago Essential (primary) hypertension   [place], Kirstie Peri, MD       Future Appointments             In 1 month [place], Kirstie Peri, MD [facility], [place]

## 2021-11-11 NOTE — Progress Notes (Signed)
Established patient visit   Patient: Sergio King   DOB: 1961/03/31   60 y.o. Male  MRN: [id number] Visit Date: 11/12/2021  Today's healthcare provider: Lelon Huh, MD   Chief Complaint  Patient presents with   Hypertension   Subjective    HPI  Hypertension, follow-up  BP Readings from Last 3 Encounters:  11/12/21 (!) 157/68  08/11/21 (!) 142/72  09/30/20 (!) 144/82   Wt Readings from Last 3 Encounters:  11/12/21 230 lb (104.3 kg)  08/11/21 236 lb (107 kg)  09/30/20 234 lb (106.1 kg)     He was last seen for hypertension 3 months ago.  BP at that visit was 142/72. Management since that visit includes changing benazepril to benazepril-hctz .  He reports excellent compliance with treatment. He is not having side effects.  He is following a Low Sodium diet. He is exercising. He does not smoke.  Use of agents associated with hypertension: NSAIDS.   Outside blood pressures are not being checked. Symptoms: No chest pain No chest pressure  No palpitations No syncope  No dyspnea No orthopnea  No paroxysmal nocturnal dyspnea No lower extremity edema   Pertinent labs: Lab Results  Component Value Date   CHOL 149 08/11/2021   HDL 43 08/11/2021   LDLCALC 74 08/11/2021   TRIG 188 (H) 08/11/2021   CHOLHDL 3.5 08/11/2021   Lab Results  Component Value Date   NA 141 08/11/2021   K 4.1 08/11/2021   CREATININE 1.04 08/11/2021   EGFR 82 08/11/2021   GLUCOSE 122 (H) 08/11/2021   TSH 1.020 03/04/2018     The 10-year ASCVD risk score (Arnett DK, et al., 2019) is: 12.1%   ---------------------------------------------------------------------------------------------------     Medications: Outpatient Medications Prior to Visit  Medication Sig   amLODipine (NORVASC) 10 MG tablet Take 1 tablet (10 mg total) by mouth daily.   aspirin EC 81 MG tablet Take 81 mg by mouth daily.   atenolol-chlorthalidone (TENORETIC) 50-25 MG tablet TAKE 1/2 TABLET BY MOUTH  EVERY DAY   atorvastatin (LIPITOR) 40 MG tablet TAKE 1 TABLET BY MOUTH EVERY DAY   benazepril-hydrochlorthiazide (LOTENSIN HCT) 20-12.5 MG tablet Take 1 tablet by mouth daily. TAKE IN PLACE OF BENAZEPRIL 20MG TABLETS   BIOTIN PO Take by mouth.   citalopram (CELEXA) 20 MG tablet TAKE 1 TABLET BY MOUTH EVERY DAY   Coenzyme Q10 (COQ10 PO) Take by mouth.   fluconazole (DIFLUCAN) 150 MG tablet TAKE 1 TABLET BY MOUTH WEEKLY AS NEEDED   GLUCOSAMINE-CHONDROITIN PO Take by mouth.   ketoconazole (NIZORAL) 2 % cream APPLY TOPICALLY DAILY AS NEEDED FOR IRRITATION. APPLY EXTERNALLY AS NEEDED   Multiple Vitamin (MULTIVITAMIN) tablet Take 1 tablet by mouth daily.   Omega-3 Fatty Acids (FISH OIL PO) Take by mouth.   sildenafil (VIAGRA) 50 MG tablet TAKE 1 TABLET BY MOUTH DAILY AS NEEDED FOR ERECTILE DYSFUNCTION.   LORazepam (ATIVAN) 0.5 MG tablet TAKE 1 TABLET BY MOUTH EVERY NIGHT AT BEDTIME   No facility-administered medications prior to visit.    Review of Systems  Constitutional: Negative.   Respiratory: Negative.    Cardiovascular: Negative.   Gastrointestinal: Negative.   Neurological:  Negative for dizziness, syncope, speech difficulty, weakness, light-headedness, numbness and headaches.      Objective    BP (!) 157/68 (BP Location: Left Arm, Patient Position: Sitting, Cuff Size: Large)   Pulse (!) 48   Temp 98.1 F (36.7 C) (Oral)   Wt 230 lb (  104.3 kg)   SpO2 100%   BMI 31.19 kg/m    Physical Exam  General appearance: Well developed, well nourished male, cooperative and in no acute distress Head: Normocephalic, without obvious abnormality, atraumatic Respiratory: Respirations even and unlabored, normal respiratory rate Extremities: All extremities are intact.  Skin: Skin color, texture, turgor normal. No rashes seen  Psych: Appropriate mood and affect. Neurologic: Mental status: Alert, oriented to person, place, and time, thought content appropriate.     Assessment & Plan      1. Essential (primary) hypertension Slightly improved improved with addition of 12.32m hctz.   Increase to benazepril-hydrochlorthiazide (LOTENSIN HCT) 20-25 MG tablet; Take 1 tablet by mouth daily.  Dispense: 90 tablet; Refill: 1   2. Coronary artery disease involving native coronary artery of native heart without angina pectoris Asymptomatic. Compliant with medication.  Continue aggressive risk factor modification.  Continue low dose beta blocker which he is tolerating well.   Next CDL is around the middle of next year, so will see back here a few months before then.     The entirety of the information documented in the History of Present Illness, Review of Systems and Physical Exam were personally obtained by me. Portions of this information were initially documented by the CMA and reviewed by me for thoroughness and accuracy.     DLelon Huh MD  [phone](phone) [phone](fax)  [place]

## 2021-11-12 ENCOUNTER — Other Ambulatory Visit: Payer: Self-pay

## 2021-11-12 ENCOUNTER — Ambulatory Visit (INDEPENDENT_AMBULATORY_CARE_PROVIDER_SITE_OTHER): Payer: 59 | Admitting: Family Medicine

## 2021-11-12 ENCOUNTER — Encounter: Payer: Self-pay | Admitting: Family Medicine

## 2021-11-12 VITALS — BP 140/62 | HR 48 | Temp 98.1°F | Wt 230.0 lb

## 2021-11-12 DIAGNOSIS — I10 Essential (primary) hypertension: Secondary | ICD-10-CM

## 2021-11-12 DIAGNOSIS — Z23 Encounter for immunization: Secondary | ICD-10-CM | POA: Diagnosis not present

## 2021-11-12 DIAGNOSIS — I2510 Atherosclerotic heart disease of native coronary artery without angina pectoris: Secondary | ICD-10-CM

## 2021-11-12 MED ORDER — BENAZEPRIL-HYDROCHLOROTHIAZIDE 20-25 MG PO TABS
1.0000 | ORAL_TABLET | Freq: Every day | ORAL | 1 refills | Status: DC
Start: 2021-11-12 — End: 2022-05-13

## 2021-11-12 NOTE — Patient Instructions (Signed)
Please review the attached list of medications and notify my office if there are any errors.   I sent in a prescription for a higher dose of benazepril-hctz 20/25mg  tablets. You can finished the 20/12.5mg  tablets if you like.

## 2021-11-12 NOTE — Addendum Note (Signed)
Addended by: Ashley Royalty E on: 11/12/2021 10:01 AM   Modules accepted: Orders

## 2021-12-24 ENCOUNTER — Other Ambulatory Visit: Payer: Self-pay | Admitting: Family Medicine

## 2021-12-24 DIAGNOSIS — I10 Essential (primary) hypertension: Secondary | ICD-10-CM

## 2022-01-30 ENCOUNTER — Other Ambulatory Visit: Payer: Self-pay | Admitting: Family Medicine

## 2022-01-30 NOTE — Telephone Encounter (Signed)
Requested medications are due for refill today.  unsure  Requested medications are on the active medications list.  yes  Last refill. 01/27/2021  Future visit scheduled.   yes  Notes to clinic.  Medication not assigned to a protocol.    Requested Prescriptions  Pending Prescriptions Disp Refills   fluconazole (DIFLUCAN) 150 MG tablet [Pharmacy Med Name: FLUCONAZOLE 150 MG TABLET] 4 tablet 5    Sig: TAKE 1 TABLET BY MOUTH WEEKLY AS NEEDED     Off-Protocol Failed - 01/30/2022  1:54 AM      Failed - Medication not assigned to a protocol, review manually.      Passed - Valid encounter within last 12 months    Recent Outpatient Visits           2 months ago Essential (primary) hypertension   [facility] Birdie Sons, MD   5 months ago Essential (primary) hypertension   [facility], Kirstie Peri, MD   1 year ago Essential (primary) hypertension   [facility] Birdie Sons, MD   1 year ago Adult situational stress disorder   [facility] Birdie Sons, MD   2 years ago Annual physical exam   [facility] Birdie Sons, MD       Future Appointments             In 2 weeks Gwyneth Sprout, [place], [place]

## 2022-02-09 ENCOUNTER — Other Ambulatory Visit: Payer: Self-pay | Admitting: Family Medicine

## 2022-02-09 DIAGNOSIS — N529 Male erectile dysfunction, unspecified: Secondary | ICD-10-CM

## 2022-02-16 NOTE — Progress Notes (Signed)
Argentina Ponder [place],acting as a scribe for Gwyneth Sprout, FNP.,have documented all relevant documentation on the behalf of Gwyneth Sprout, FNP,as directed by  Gwyneth Sprout, FNP while in the presence of Gwyneth Sprout, FNP.   Established patient visit   Patient: Sergio King   DOB: [birth year]   61 y.o. Male  MRN: [id number] Visit Date: 02/17/2022  Today's healthcare provider: Gwyneth Sprout, FNP   No chief complaint on file.  Subjective    HPI  Hypertension, follow-up  BP Readings from Last 3 Encounters:  02/17/22 139/76  11/12/21 140/62  08/11/21 (!) 142/72   Wt Readings from Last 3 Encounters:  02/17/22 234 lb (106.1 kg)  11/12/21 230 lb (104.3 kg)  08/11/21 236 lb (107 kg)     He was last seen for hypertension 3 months ago.  BP at that visit was as above. Management since that visit includes increased Lotensin on last visit after increasing HCTZ on previous visit.   He reports good compliance with treatment. He is not having side effects.  He is following a Low Sodium diet. He is exercising. He does not smoke.  Use of agents associated with hypertension: NSAIDS.   Outside blood pressures are not being checked. Symptoms: No chest pain No chest pressure  No palpitations No syncope  No dyspnea No orthopnea  No paroxysmal nocturnal dyspnea No lower extremity edema   Pertinent labs: Lab Results  Component Value Date   CHOL 149 08/11/2021   HDL 43 08/11/2021   LDLCALC 74 08/11/2021   TRIG 188 (H) 08/11/2021   CHOLHDL 3.5 08/11/2021   Lab Results  Component Value Date   NA 141 08/11/2021   K 4.1 08/11/2021   CREATININE 1.04 08/11/2021   EGFR 82 08/11/2021   GLUCOSE 122 (H) 08/11/2021   TSH 1.020 03/04/2018     The 10-year ASCVD risk score (Arnett DK, et al., 2019) is: 9.9%   ---------------------------------------------------------------------------------------------------   Medications: Outpatient Medications Prior to Visit  Medication Sig    amLODipine (NORVASC) 10 MG tablet Take 1 tablet (10 mg total) by mouth daily.   aspirin EC 81 MG tablet Take 81 mg by mouth daily.   atenolol-chlorthalidone (TENORETIC) 50-25 MG tablet TAKE 1/2 TABLET BY MOUTH EVERY DAY   atorvastatin (LIPITOR) 40 MG tablet TAKE 1 TABLET BY MOUTH EVERY DAY   benazepril-hydrochlorthiazide (LOTENSIN HCT) 20-25 MG tablet Take 1 tablet by mouth daily.   BIOTIN PO Take by mouth.   citalopram (CELEXA) 20 MG tablet TAKE 1 TABLET BY MOUTH EVERY DAY   Coenzyme Q10 (COQ10 PO) Take by mouth.   fluconazole (DIFLUCAN) 150 MG tablet TAKE 1 TABLET BY MOUTH WEEKLY AS NEEDED   GLUCOSAMINE-CHONDROITIN PO Take by mouth.   ketoconazole (NIZORAL) 2 % cream APPLY TOPICALLY DAILY AS NEEDED FOR IRRITATION. APPLY EXTERNALLY AS NEEDED   LORazepam (ATIVAN) 0.5 MG tablet TAKE 1 TABLET BY MOUTH EVERY NIGHT AT BEDTIME   Multiple Vitamin (MULTIVITAMIN) tablet Take 1 tablet by mouth daily.   Omega-3 Fatty Acids (FISH OIL PO) Take by mouth.   sildenafil (VIAGRA) 50 MG tablet TAKE 1 TABLET BY MOUTH DAILY AS NEEDED FOR ERECTILE DYSFUNCTION.   No facility-administered medications prior to visit.    Review of Systems      Objective    BP 139/76    Pulse 76    Temp 99.2 F (37.3 C) (Oral)    Wt 234 lb (106.1 kg)    SpO2 95%  BMI 31.74 kg/m     Physical Exam Vitals and nursing note reviewed.  Constitutional:      Appearance: Normal appearance. He is obese.  HENT:     Head: Normocephalic and atraumatic.  Eyes:     Pupils: Pupils are equal, round, and reactive to light.  Cardiovascular:     Rate and Rhythm: Normal rate and regular rhythm.     Pulses: Normal pulses.     Heart sounds: Normal heart sounds.  Pulmonary:     Effort: Pulmonary effort is normal.     Breath sounds: Normal breath sounds.  Musculoskeletal:        General: Normal range of motion.     Cervical back: Normal range of motion.  Skin:    General: Skin is warm and dry.     Capillary Refill: Capillary refill  takes less than 2 seconds.  Neurological:     General: No focal deficit present.     Mental Status: He is alert and oriented to person, place, and time. Mental status is at baseline.  Psychiatric:        Mood and Affect: Mood normal.        Behavior: Behavior normal.        Thought Content: Thought content normal.        Judgment: Judgment normal.       No results found for any visits on 02/17/22.  Assessment & Plan     Problem List Items Addressed This Visit       Cardiovascular and Mediastinum   Essential (primary) hypertension - Primary    Chronic, stable Denies CP Denies SOB Denies DOE No LE Edema noted on exam Continue medications Refills stable Seek emergent care if you develop CP, chest pain or chest pressure         Return in about 6 months (around 08/17/2022) for annual examination.      Vonna Kotyk, FNP, have reviewed all documentation for this visit. The documentation on 02/17/22 for the exam, diagnosis, procedures, and orders are all accurate and complete.    Gwyneth Sprout, [place] [phone] (phone) [phone] (fax)  [place]

## 2022-02-17 ENCOUNTER — Ambulatory Visit (INDEPENDENT_AMBULATORY_CARE_PROVIDER_SITE_OTHER): Payer: 59 | Admitting: Family Medicine

## 2022-02-17 ENCOUNTER — Other Ambulatory Visit: Payer: Self-pay

## 2022-02-17 ENCOUNTER — Ambulatory Visit: Payer: 59 | Admitting: Family Medicine

## 2022-02-17 ENCOUNTER — Encounter: Payer: Self-pay | Admitting: Family Medicine

## 2022-02-17 VITALS — BP 139/76 | HR 76 | Temp 99.2°F | Wt 234.0 lb

## 2022-02-17 DIAGNOSIS — I10 Essential (primary) hypertension: Secondary | ICD-10-CM

## 2022-02-17 NOTE — Assessment & Plan Note (Addendum)
Chronic, stable Denies CP Denies SOB Denies DOE No LE Edema noted on exam Continue medications Refills stable Seek emergent care if you develop CP, chest pain or chest pressure

## 2022-02-17 NOTE — Patient Instructions (Signed)
Continue to recommend balanced, lower carb meals. Smaller meal size, adding snacks. Choosing water as drink of choice and increasing purposeful exercise.  

## 2022-04-22 ENCOUNTER — Other Ambulatory Visit: Payer: Self-pay | Admitting: Family Medicine

## 2022-04-22 DIAGNOSIS — N529 Male erectile dysfunction, unspecified: Secondary | ICD-10-CM

## 2022-05-13 ENCOUNTER — Other Ambulatory Visit: Payer: Self-pay | Admitting: Family Medicine

## 2022-05-13 DIAGNOSIS — I10 Essential (primary) hypertension: Secondary | ICD-10-CM

## 2022-07-14 ENCOUNTER — Other Ambulatory Visit: Payer: Self-pay | Admitting: Family Medicine

## 2022-07-14 DIAGNOSIS — I10 Essential (primary) hypertension: Secondary | ICD-10-CM

## 2022-07-15 NOTE — Telephone Encounter (Signed)
Rx expired- but covers through next appointment 08/18/22- 06/02/21 #90 4RF Requested Prescriptions  Pending Prescriptions Disp Refills  . amLODipine (NORVASC) 10 MG tablet [Pharmacy Med Name: AMLODIPINE BESYLATE 10 MG TAB] 90 tablet 4    Sig: TAKE 1 TABLET BY MOUTH EVERY DAY     Cardiovascular: Calcium Channel Blockers 2 Passed - 07/14/2022  7:52 AM      Passed - Last BP in normal range    BP Readings from Last 1 Encounters:  02/17/22 139/76         Passed - Last Heart Rate in normal range    Pulse Readings from Last 1 Encounters:  02/17/22 76         Passed - Valid encounter within last 6 months    Recent Outpatient Visits          4 months ago Essential (primary) hypertension   [facility] Gwyneth Sprout, FNP   8 months ago Essential (primary) hypertension   [facility] Birdie Sons, MD   11 months ago Essential (primary) hypertension   [facility], Kirstie Peri, MD   1 year ago Essential (primary) hypertension   [facility] Birdie Sons, MD   2 years ago Adult situational stress disorder   [place], Kirstie Peri, MD      Future Appointments            In 1 month [place], Kirstie Peri, MD [facility], [place]

## 2022-08-17 NOTE — Progress Notes (Unsigned)
I,Roshena L Chambers,acting as a scribe for Lelon Huh, MD.,have documented all relevant documentation on the behalf of Lelon Huh, MD,as directed by  Lelon Huh, MD while in the presence of Lelon Huh, MD.   Complete physical exam   Patient: Sergio King   DOB: 05/15/1961   61 y.o. Male  MRN: [id number] Visit Date: 08/18/2022  Today's healthcare provider: Lelon Huh, MD   Chief Complaint  Patient presents with   Annual Exam   Hypertension   Hyperlipidemia   Hyperglycemia   Subjective    Sergio King is a 61 y.o. male who presents today for a complete physical exam.  He reports consuming a general diet. The patient does not participate in regular exercise at present. He generally feels fairly well. He reports sleeping fairly well. He does have additional problems to discuss today.  HPI  Hypertension, follow-up  BP Readings from Last 3 Encounters:  08/18/22 (!) 143/70  02/17/22 139/76  11/12/21 140/62   Wt Readings from Last 3 Encounters:  08/18/22 233 lb (105.7 kg)  02/17/22 234 lb (106.1 kg)  11/12/21 230 lb (104.3 kg)     He was last seen for hypertension 6 months ago.  BP at that visit was 139/76. Management since that visit includes continuing same medication.  He reports good compliance with treatment. He is not having side effects.  He is following a Regular diet. He is not exercising. He does not smoke.  Use of agents associated with hypertension: NSAIDS.   Outside blood pressures are not checked. Symptoms: No chest pain No chest pressure  No palpitations No syncope  No dyspnea No orthopnea  No paroxysmal nocturnal dyspnea No lower extremity edema   Pertinent labs  The 10-year ASCVD risk score (Arnett DK, et al., 2019) is: 11.3%  ---------------------------------------------------------------------------------------------------   Hyperglycemia, Follow-up  Lab Results  Component Value Date   HGBA1C 5.8 (H) 08/11/2021    HGBA1C 5.4 03/04/2018   HGBA1C 5.4 02/12/2017   GLUCOSE 122 (H) 08/11/2021   GLUCOSE 121 (H) 12/05/2019   GLUCOSE 102 (H) 11/28/2018    Last seen for for this1  year  ago.  Management since that visit includes encouraging patient to strictly avoid sweets and white starchy foods. Current symptoms include none and have been stable.  Prior visit with dietician: no Current diet: well balanced Current exercise: none    -----------------------------------------------------------------------------------------   Lipid/Cholesterol, Follow-up  Last lipid panel Other pertinent labs  Lab Results  Component Value Date   CHOL 149 08/11/2021   HDL 43 08/11/2021   LDLCALC 74 08/11/2021   TRIG 188 (H) 08/11/2021   CHOLHDL 3.5 08/11/2021   Lab Results  Component Value Date   ALT 43 08/11/2021   AST 30 08/11/2021   PLT 272 08/11/2021   TSH 1.020 03/04/2018     He was last seen for this 1  year  ago.  Management since that visit includes continue same medication.  He reports good compliance with treatment. He is not having side effects.   Symptoms: No chest pain No chest pressure/discomfort  No dyspnea No lower extremity edema  No numbness or tingling of extremity No orthopnea  No palpitations No paroxysmal nocturnal dyspnea  No speech difficulty No syncope   Current diet: well balanced Current exercise: none    ---------------------------------------------------------------------------------------------------   He also states he thinks he has been having issues with gout since he started having pain in his right hip in March. Pain then moving  to right ankle which became red and swollen. He made some dietary changes, started on Tumeric, cherry juice and a Uric Acid reducing supplement he found on [place] and symptoms completely resolved for a couple of months, but his hip pain return after stopping the supplements.   Past Medical History:  Diagnosis Date   [place]oe of left  foot    Hypertension    Past Surgical History:  Procedure Laterality Date   CARDIAC CATHETERIZATION     2 stents placed. over 3 yrs ago. [facility]   COLONOSCOPY WITH PROPOFOL N/A 09/24/2017   Procedure: COLONOSCOPY WITH PROPOFOL;  Surgeon: Lucilla Lame, MD;  Location: [place];  Service: Gastroenterology;  Laterality: N/A;   CORONARY STENT PLACEMENT Left 10/12/2012   99% LAD and 75% first diagonal [facility]   FOOT SURGERY Right 2007   Hammer toe surgery   POLYPECTOMY  09/24/2017   Procedure: POLYPECTOMY;  Surgeon: Lucilla Lame, MD;  Location: [place];  Service: Gastroenterology;;   VASECTOMY  1996   Social History   Socioeconomic History   Marital status: Divorced    Spouse name: Not on file   Number of children: 2   Years of education: Not on file   Highest education level: Not on file  Occupational History   Occupation: Employed    Employer: [employer]    Comment: full time  Tobacco Use   Smoking status: Never   Smokeless tobacco: Never  Vaping Use   Vaping Use: Never used  Substance and Sexual Activity   Alcohol use: Yes    Alcohol/week: 18.0 standard drinks of alcohol    Types: 18 Standard drinks or equivalent per week    Comment: 1 case of beer per week (18-24 beers)   Drug use: No   Sexual activity: Not on file  Other Topics Concern   Not on file  Social History Narrative   Not on file   Social Determinants of Health   Financial Resource Strain: Not on file  Food Insecurity: Not on file  Transportation Needs: Not on file  Physical Activity: Not on file  Stress: Not on file  Social Connections: Not on file  Intimate Partner Violence: Not on file   Family Status  Relation Name Status   Mother  Alive       DDD, Gallbladder removed   Father  Alive   Brother  Alive   Brother  Alive   Brother  Alive   Family History  Problem Relation Age of Onset   CAD Father    No Known Allergies  Patient Care Team: Birdie Sons, MD as PCP -  General (Family Medicine) Yolonda Kida, MD as Consulting Physician (Cardiology) Ree Edman, MD (Dermatology) Lucilla Lame, MD as Consulting Physician (Gastroenterology)   Medications: Outpatient Medications Prior to Visit  Medication Sig   amLODipine (NORVASC) 10 MG tablet TAKE 1 TABLET BY MOUTH EVERY DAY   aspirin EC 81 MG tablet Take 81 mg by mouth daily.   atenolol-chlorthalidone (TENORETIC) 50-25 MG tablet TAKE 1/2 TABLET BY MOUTH EVERY DAY   atorvastatin (LIPITOR) 40 MG tablet TAKE 1 TABLET BY MOUTH EVERY DAY   benazepril-hydrochlorthiazide (LOTENSIN HCT) 20-25 MG tablet TAKE 1 TABLET BY MOUTH EVERY DAY   BIOTIN PO Take by mouth.   citalopram (CELEXA) 20 MG tablet TAKE 1 TABLET BY MOUTH EVERY DAY   Coenzyme Q10 (COQ10 PO) Take by mouth.   fluconazole (DIFLUCAN) 150 MG tablet TAKE 1 TABLET BY MOUTH WEEKLY  AS NEEDED   GLUCOSAMINE-CHONDROITIN PO Take by mouth.   ketoconazole (NIZORAL) 2 % cream APPLY TOPICALLY DAILY AS NEEDED FOR IRRITATION. APPLY EXTERNALLY AS NEEDED   LORazepam (ATIVAN) 0.5 MG tablet TAKE 1 TABLET BY MOUTH EVERY NIGHT AT BEDTIME   Multiple Vitamin (MULTIVITAMIN) tablet Take 1 tablet by mouth daily.   Omega-3 Fatty Acids (FISH OIL PO) Take by mouth.   sildenafil (VIAGRA) 50 MG tablet TAKE 1 TABLET BY MOUTH DAILY AS NEEDED FOR ERECTILE DYSFUNCTION.   No facility-administered medications prior to visit.    Review of Systems  Constitutional:  Negative for appetite change, chills, fatigue and fever.  HENT:  Negative for congestion, ear pain, hearing loss, nosebleeds and trouble swallowing.   Eyes:  Positive for photophobia. Negative for pain and visual disturbance.  Respiratory:  Negative for cough, chest tightness and shortness of breath.   Cardiovascular:  Negative for chest pain, palpitations and leg swelling.  Gastrointestinal:  Negative for abdominal pain, blood in stool, constipation, diarrhea, nausea and vomiting.  Endocrine: Negative for  polydipsia, polyphagia and polyuria.  Genitourinary:  Negative for dysuria and flank pain.  Musculoskeletal:  Positive for arthralgias. Negative for back pain, joint swelling, myalgias and neck stiffness.  Skin:  Positive for rash. Negative for color change and wound.  Neurological:  Negative for dizziness, tremors, seizures, speech difficulty, weakness, light-headedness and headaches.  Psychiatric/Behavioral:  Negative for behavioral problems, confusion, decreased concentration, dysphoric mood and sleep disturbance. The patient is not nervous/anxious.   All other systems reviewed and are negative.     Objective     BP (!) 143/70 (BP Location: Left Arm, Patient Position: Sitting, Cuff Size: Large)   Pulse (!) 52   Temp 97.7 F (36.5 C) (Oral)   Resp 14   Ht 6' (1.829 m)   Wt 233 lb (105.7 kg)   SpO2 96% Comment: room air  BMI 31.60 kg/m    Today's Vitals   08/18/22 0909 08/18/22 0917  BP: (!) 143/72 (!) 143/70  Pulse: (!) 52 (!) 52  Resp: 14   Temp: 97.7 F (36.5 C)   TempSrc: Oral   SpO2: 96%   Weight: 233 lb (105.7 kg)   Height: 6' (1.829 m)    Body mass index is 31.6 kg/m.   Physical Exam   General Appearance:    Overweight male. Alert, cooperative, in no acute distress, appears stated age  Head:    Normocephalic, without obvious abnormality, atraumatic  Eyes:    PERRL, conjunctiva/corneas clear, EOM's intact, fundi    benign, both eyes       Ears:    Normal TM's and external ear canals, both ears  Nose:   Nares normal, septum midline, mucosa normal, no drainage   or sinus tenderness  Throat:   Lips, mucosa, and tongue normal; teeth and gums normal  Neck:   Supple, symmetrical, trachea midline, no adenopathy;       thyroid:  No enlargement/tenderness/nodules; no carotid   bruit or JVD  Back:     Symmetric, no curvature, ROM normal, no CVA tenderness  Lungs:     Clear to auscultation bilaterally, respirations unlabored  Chest wall:    No tenderness or  deformity  Heart:    Bradycardic. Normal rhythm. No murmurs, rubs, or gallops.  S1 and S2 normal  Abdomen:     Soft, non-tender, bowel sounds active all four quadrants,    no masses, no organomegaly  Genitalia:    deferred  Rectal:  deferred  Extremities:   All extremities are intact. No cyanosis or edema  Pulses:   2+ and symmetric all extremities  Skin:   Skin color, texture, turgor normal, no rashes or lesions  Lymph nodes:   Cervical, supraclavicular, and axillary nodes normal  Neurologic:   CNII-XII intact. Normal strength, sensation and reflexes      throughout     Last depression screening scores    08/18/2022    9:11 AM 02/17/2022    1:19 PM 09/30/2020    1:53 PM  PHQ 2/9 Scores  PHQ - 2 Score 0 0 0  PHQ- 9 Score 1 0 3   Last fall risk screening    08/18/2022    9:11 AM  [place] in the past year? 0  Number falls in past yr: 0  Injury with Fall? 0  Risk for fall due to : No Fall Risks  Follow up Falls evaluation completed   Last Audit-C alcohol use screening    02/17/2022    1:18 PM  Alcohol Use Disorder Test (AUDIT)  1. How often do you have a drink containing alcohol? 4  2. How many drinks containing alcohol do you have on a typical day when you are drinking? 0  3. How often do you have six or more drinks on one occasion? 2  AUDIT-C Score 6   A score of 3 or more in women, and 4 or more in men indicates increased risk for alcohol abuse, EXCEPT if all of the points are from question 1   No results found for any visits on 08/18/22.  Assessment & Plan    Routine Health Maintenance and Physical Exam  Exercise Activities and Dietary recommendations  Goals   None     Immunization History  Administered Date(s) Administered   Influenza,inj,Quad PF,6+ Mos 11/28/2019, 09/30/2020, 11/12/2021   Influenza-Unspecified 10/29/2017, 09/27/2018   Td 03/04/2018   Tdap 09/28/2007   Zoster Recombinat (Shingrix) 11/28/2019, 02/20/2020    Health  Maintenance  Topic Date Due   COVID-19 Vaccine (1) Never done   INFLUENZA VACCINE  07/28/2022   COLONOSCOPY (Pts 45-28yr Insurance coverage will need to be confirmed)  09/24/2022   TETANUS/TDAP  03/04/2028   Hepatitis C Screening  Completed   HIV Screening  Completed   Zoster Vaccines- Shingrix  Completed   HPV VACCINES  Aged Out    Discussed health benefits of physical activity, and encouraged him to engage in regular exercise appropriate for his age and condition.   2. Prostate cancer screening  - PSA Total (Reflex To Free) ([facility] only)  3. Essential (primary) hypertension SBP not quite at goal. Discussed increasing dose of benazepril versus working on diet and lowing weight. He has to get SBP under 140 later this fall. He is going to work on losing weight and monitor BP at home, but if not able to get under 140 he is to call back and will increase benazepril dose.  - TSH  4. Coronary artery disease involving native coronary artery of native heart without angina pectoris Asymptomatic. Compliant with medication.  Continue aggressive risk factor modification.  Continue routine follow up Dr. CClayborn Bigness   5. Hyperglycemia  - Hemoglobin A1c  6. Hyperglyceridemia, pure  - CBC - Comprehensive metabolic panel - Lipid panel  7. Hip pain, unspecified laterality He suspects this is due to gout since he also had pain and swelling in ankle which resolved with OTC uric acid reducing supplements.  8. Gout, unspecified cause, unspecified chronicity, unspecified site  - Uric acid    The entirety of the information documented in the History of Present Illness, Review of Systems and Physical Exam were personally obtained by me. Portions of this information were initially documented by the CMA and reviewed by me for thoroughness and accuracy.     Lelon Huh, MD  [facility] [phone] (phone) [phone] (fax)  [place]

## 2022-08-18 ENCOUNTER — Ambulatory Visit (INDEPENDENT_AMBULATORY_CARE_PROVIDER_SITE_OTHER): Payer: 59 | Admitting: Family Medicine

## 2022-08-18 ENCOUNTER — Encounter: Payer: Self-pay | Admitting: Family Medicine

## 2022-08-18 VITALS — BP 143/70 | HR 52 | Temp 97.7°F | Resp 14 | Ht 72.0 in | Wt 233.0 lb

## 2022-08-18 DIAGNOSIS — I10 Essential (primary) hypertension: Secondary | ICD-10-CM | POA: Diagnosis not present

## 2022-08-18 DIAGNOSIS — E781 Pure hyperglyceridemia: Secondary | ICD-10-CM

## 2022-08-18 DIAGNOSIS — Z125 Encounter for screening for malignant neoplasm of prostate: Secondary | ICD-10-CM | POA: Diagnosis not present

## 2022-08-18 DIAGNOSIS — R739 Hyperglycemia, unspecified: Secondary | ICD-10-CM | POA: Diagnosis not present

## 2022-08-18 DIAGNOSIS — Z0000 Encounter for general adult medical examination without abnormal findings: Secondary | ICD-10-CM | POA: Diagnosis not present

## 2022-08-18 DIAGNOSIS — I2510 Atherosclerotic heart disease of native coronary artery without angina pectoris: Secondary | ICD-10-CM | POA: Diagnosis not present

## 2022-08-18 DIAGNOSIS — M25559 Pain in unspecified hip: Secondary | ICD-10-CM

## 2022-08-18 DIAGNOSIS — M109 Gout, unspecified: Secondary | ICD-10-CM

## 2022-08-20 LAB — CBC
Hematocrit: 45.4 % (ref 37.5–51.0)
Hemoglobin: 15.5 g/dL (ref 13.0–17.7)
MCH: 29.9 pg (ref 26.6–33.0)
MCHC: 34.1 g/dL (ref 31.5–35.7)
MCV: 88 fL (ref 79–97)
Platelets: 249 10*3/uL (ref 150–450)
RBC: 5.18 x10E6/uL (ref 4.14–5.80)
RDW: 13.1 % (ref 11.6–15.4)
WBC: 4.9 10*3/uL (ref 3.4–10.8)

## 2022-08-20 LAB — LIPID PANEL
Chol/HDL Ratio: 4.2 ratio (ref 0.0–5.0)
Cholesterol, Total: 178 mg/dL (ref 100–199)
HDL: 42 mg/dL (ref >39)
LDL Chol Calc (NIH): 106 mg/dL — ABNORMAL HIGH (ref 0–99)
Triglycerides: 171 mg/dL — ABNORMAL HIGH (ref 0–149)
VLDL Cholesterol Cal: 30 mg/dL (ref 5–40)

## 2022-08-20 LAB — COMPREHENSIVE METABOLIC PANEL
ALT: 45 IU/L — ABNORMAL HIGH (ref 0–44)
AST: 28 IU/L (ref 0–40)
Albumin/Globulin Ratio: 2.2 (ref 1.2–2.2)
Albumin: 4.7 g/dL (ref 3.9–4.9)
Alkaline Phosphatase: 71 IU/L (ref 44–121)
BUN/Creatinine Ratio: 19 (ref 10–24)
BUN: 19 mg/dL (ref 8–27)
Bilirubin Total: 0.6 mg/dL (ref 0.0–1.2)
CO2: 25 mmol/L (ref 20–29)
Calcium: 9.8 mg/dL (ref 8.6–10.2)
Chloride: 102 mmol/L (ref 96–106)
Creatinine, Ser: 0.99 mg/dL (ref 0.76–1.27)
Globulin, Total: 2.1 g/dL (ref 1.5–4.5)
Glucose: 126 mg/dL — ABNORMAL HIGH (ref 70–99)
Potassium: 4.2 mmol/L (ref 3.5–5.2)
Sodium: 141 mmol/L (ref 134–144)
Total Protein: 6.8 g/dL (ref 6.0–8.5)
eGFR: 87 mL/min/{1.73_m2} (ref >59)

## 2022-08-20 LAB — PSA TOTAL (REFLEX TO FREE): Prostate Specific Ag, Serum: 1.8 ng/mL (ref 0.0–4.0)

## 2022-08-20 LAB — URIC ACID: Uric Acid: 7.3 mg/dL (ref 3.8–8.4)

## 2022-08-20 LAB — TSH: TSH: 1.18 u[IU]/mL (ref 0.450–4.500)

## 2022-08-20 LAB — HEMOGLOBIN A1C
Est. average glucose Bld gHb Est-mCnc: 120 mg/dL
Hgb A1c MFr Bld: 5.8 % — ABNORMAL HIGH (ref 4.8–5.6)

## 2022-09-03 ENCOUNTER — Telehealth: Payer: Self-pay | Admitting: Family Medicine

## 2022-09-03 DIAGNOSIS — E781 Pure hyperglyceridemia: Secondary | ICD-10-CM

## 2022-09-03 NOTE — Telephone Encounter (Signed)
Please check with patient and make sure he has increased atorvastatin to 2 x '40mg'$  tablet and if having any side effects. If tolerating will send in prescription for '80mg'$  tablets and have him return in 2-3 months to check labs.

## 2022-09-04 MED ORDER — ATORVASTATIN CALCIUM 80 MG PO TABS: 80.0000 mg | ORAL_TABLET | Freq: Every day | ORAL | 0 refills | Status: DC

## 2022-09-04 NOTE — Telephone Encounter (Signed)
As long as he is tolerating medication, we can just contact him in 2-3 months to get labs ordered.

## 2022-09-04 NOTE — Telephone Encounter (Signed)
I called and spoke with patient. He states he is tolerating the increased dose of atorvastatin well. He denies any side effects. Prescription sent into pharmacy. Patient wants to know if he needs to schedule a 3 month f/u appointment, or does he just need labs repeated in 3 months?

## 2022-09-07 NOTE — Telephone Encounter (Signed)
Patient advised.

## 2022-09-24 ENCOUNTER — Other Ambulatory Visit: Payer: Self-pay | Admitting: Family Medicine

## 2022-09-24 NOTE — Telephone Encounter (Signed)
Requested medications are due for refill today.  no  Requested medications are on the active medications list.  no  Last refill. 09/21/2021  Future visit scheduled.   no  Notes to clinic.  Dosage changed. 40 mg was d/c'd 09/04/2022.    Requested Prescriptions  Pending Prescriptions Disp Refills   atorvastatin (LIPITOR) 40 MG tablet [Pharmacy Med Name: ATORVASTATIN 40 MG TABLET] 90 tablet 4    Sig: TAKE 1 TABLET BY MOUTH EVERY DAY     Cardiovascular:  Antilipid - Statins Failed - 09/24/2022  2:23 PM      Failed - Lipid Panel in normal range within the last 12 months    Cholesterol, Total  Date Value Ref Range Status  08/19/2022 178 100 - 199 mg/dL Final   LDL Chol Calc (NIH)  Date Value Ref Range Status  08/19/2022 106 (H) 0 - 99 mg/dL Final   HDL  Date Value Ref Range Status  08/19/2022 42 >39 mg/dL Final   Triglycerides  Date Value Ref Range Status  08/19/2022 171 (H) 0 - 149 mg/dL Final         Passed - Patient is not pregnant      Passed - Valid encounter within last 12 months    Recent Outpatient Visits           1 month ago Annual physical exam   [facility] Birdie Sons, MD   7 months ago Essential (primary) hypertension   [facility] Gwyneth Sprout, FNP   10 months ago Essential (primary) hypertension   [facility], Kirstie Peri, MD   1 year ago Essential (primary) hypertension   [facility], Kirstie Peri, MD   1 year ago Essential (primary) hypertension   [facility], Kirstie Peri, MD

## 2022-09-26 ENCOUNTER — Other Ambulatory Visit: Payer: Self-pay | Admitting: Family Medicine

## 2022-09-26 DIAGNOSIS — N529 Male erectile dysfunction, unspecified: Secondary | ICD-10-CM

## 2022-10-04 ENCOUNTER — Other Ambulatory Visit: Payer: Self-pay | Admitting: Family Medicine

## 2022-10-10 ENCOUNTER — Other Ambulatory Visit: Payer: Self-pay | Admitting: Family Medicine

## 2022-10-10 DIAGNOSIS — F4320 Adjustment disorder, unspecified: Secondary | ICD-10-CM

## 2022-10-11 ENCOUNTER — Other Ambulatory Visit: Payer: Self-pay | Admitting: Family Medicine

## 2022-10-11 DIAGNOSIS — I10 Essential (primary) hypertension: Secondary | ICD-10-CM

## 2022-11-10 ENCOUNTER — Telehealth: Payer: Self-pay | Admitting: Family Medicine

## 2022-11-10 DIAGNOSIS — E781 Pure hyperglyceridemia: Secondary | ICD-10-CM

## 2022-11-10 DIAGNOSIS — I2510 Atherosclerotic heart disease of native coronary artery without angina pectoris: Secondary | ICD-10-CM

## 2022-11-10 NOTE — Telephone Encounter (Signed)
Please advise is time to recheck lipids since increasing dose in August. Have placed order. Should be fasting when getting blood drawn.

## 2022-11-11 NOTE — Telephone Encounter (Signed)
Patient advised and verbalized understanding. Lab order placed up front at [address].

## 2022-11-26 LAB — COMPREHENSIVE METABOLIC PANEL
ALT: 50 IU/L — ABNORMAL HIGH (ref 0–44)
AST: 30 IU/L (ref 0–40)
Albumin/Globulin Ratio: 2.3 — ABNORMAL HIGH (ref 1.2–2.2)
Albumin: 4.9 g/dL (ref 3.9–4.9)
Alkaline Phosphatase: 75 IU/L (ref 44–121)
BUN/Creatinine Ratio: 22 (ref 10–24)
BUN: 25 mg/dL (ref 8–27)
Bilirubin Total: 0.7 mg/dL (ref 0.0–1.2)
CO2: 24 mmol/L (ref 20–29)
Calcium: 10.1 mg/dL (ref 8.6–10.2)
Chloride: 102 mmol/L (ref 96–106)
Creatinine, Ser: 1.12 mg/dL (ref 0.76–1.27)
Globulin, Total: 2.1 g/dL (ref 1.5–4.5)
Glucose: 131 mg/dL — ABNORMAL HIGH (ref 70–99)
Potassium: 4.1 mmol/L (ref 3.5–5.2)
Sodium: 142 mmol/L (ref 134–144)
Total Protein: 7 g/dL (ref 6.0–8.5)
eGFR: 75 mL/min/{1.73_m2} (ref >59)

## 2022-11-26 LAB — LIPID PANEL
Chol/HDL Ratio: 4 ratio (ref 0.0–5.0)
Cholesterol, Total: 166 mg/dL (ref 100–199)
HDL: 42 mg/dL (ref >39)
LDL Chol Calc (NIH): 93 mg/dL (ref 0–99)
Triglycerides: 181 mg/dL — ABNORMAL HIGH (ref 0–149)
VLDL Cholesterol Cal: 31 mg/dL (ref 5–40)

## 2022-12-06 ENCOUNTER — Other Ambulatory Visit: Payer: Self-pay | Admitting: Family Medicine

## 2022-12-19 ENCOUNTER — Inpatient Hospital Stay
Admission: EM | Admit: 2022-12-19 | Discharge: 2022-12-28 | DRG: 208 | Disposition: E | Payer: 59 | Attending: Student in an Organized Health Care Education/Training Program | Admitting: Student in an Organized Health Care Education/Training Program

## 2022-12-19 ENCOUNTER — Encounter
Admission: EM | Disposition: E | Payer: Self-pay | Source: Home / Self Care | Attending: Student in an Organized Health Care Education/Training Program

## 2022-12-19 ENCOUNTER — Emergency Department: Payer: 59

## 2022-12-19 ENCOUNTER — Encounter: Payer: Self-pay | Admitting: Certified Registered"

## 2022-12-19 DIAGNOSIS — Z7982 Long term (current) use of aspirin: Secondary | ICD-10-CM

## 2022-12-19 DIAGNOSIS — G931 Anoxic brain damage, not elsewhere classified: Secondary | ICD-10-CM | POA: Diagnosis present

## 2022-12-19 DIAGNOSIS — R578 Other shock: Secondary | ICD-10-CM

## 2022-12-19 DIAGNOSIS — E785 Hyperlipidemia, unspecified: Secondary | ICD-10-CM | POA: Diagnosis present

## 2022-12-19 DIAGNOSIS — Z955 Presence of coronary angioplasty implant and graft: Secondary | ICD-10-CM

## 2022-12-19 DIAGNOSIS — G253 Myoclonus: Secondary | ICD-10-CM | POA: Diagnosis not present

## 2022-12-19 DIAGNOSIS — Z79899 Other long term (current) drug therapy: Secondary | ICD-10-CM

## 2022-12-19 DIAGNOSIS — I252 Old myocardial infarction: Secondary | ICD-10-CM

## 2022-12-19 DIAGNOSIS — E876 Hypokalemia: Secondary | ICD-10-CM | POA: Diagnosis present

## 2022-12-19 DIAGNOSIS — Z9852 Vasectomy status: Secondary | ICD-10-CM

## 2022-12-19 DIAGNOSIS — Z515 Encounter for palliative care: Secondary | ICD-10-CM

## 2022-12-19 DIAGNOSIS — I2510 Atherosclerotic heart disease of native coronary artery without angina pectoris: Secondary | ICD-10-CM | POA: Diagnosis present

## 2022-12-19 DIAGNOSIS — Z7189 Other specified counseling: Secondary | ICD-10-CM

## 2022-12-19 DIAGNOSIS — F1490 Cocaine use, unspecified, uncomplicated: Secondary | ICD-10-CM | POA: Diagnosis present

## 2022-12-19 DIAGNOSIS — E872 Acidosis, unspecified: Secondary | ICD-10-CM | POA: Diagnosis present

## 2022-12-19 DIAGNOSIS — I10 Essential (primary) hypertension: Secondary | ICD-10-CM | POA: Diagnosis present

## 2022-12-19 DIAGNOSIS — R739 Hyperglycemia, unspecified: Secondary | ICD-10-CM | POA: Diagnosis present

## 2022-12-19 DIAGNOSIS — Z66 Do not resuscitate: Secondary | ICD-10-CM | POA: Diagnosis not present

## 2022-12-19 DIAGNOSIS — I469 Cardiac arrest, cause unspecified: Secondary | ICD-10-CM | POA: Diagnosis not present

## 2022-12-19 DIAGNOSIS — N170 Acute kidney failure with tubular necrosis: Secondary | ICD-10-CM | POA: Diagnosis present

## 2022-12-19 DIAGNOSIS — J9601 Acute respiratory failure with hypoxia: Secondary | ICD-10-CM | POA: Diagnosis not present

## 2022-12-19 DIAGNOSIS — J690 Pneumonitis due to inhalation of food and vomit: Secondary | ICD-10-CM | POA: Diagnosis present

## 2022-12-19 DIAGNOSIS — J9602 Acute respiratory failure with hypercapnia: Secondary | ICD-10-CM | POA: Diagnosis present

## 2022-12-19 DIAGNOSIS — N179 Acute kidney failure, unspecified: Secondary | ICD-10-CM

## 2022-12-19 DIAGNOSIS — Z86010 Personal history of colonic polyps: Secondary | ICD-10-CM

## 2022-12-19 DIAGNOSIS — G936 Cerebral edema: Secondary | ICD-10-CM

## 2022-12-19 DIAGNOSIS — I213 ST elevation (STEMI) myocardial infarction of unspecified site: Secondary | ICD-10-CM

## 2022-12-19 SURGERY — CORONARY/GRAFT ACUTE MI REVASCULARIZATION

## 2022-12-19 MED ORDER — SODIUM CHLORIDE 0.9 % IV SOLN: INTRAVENOUS | Status: DC

## 2022-12-19 MED ORDER — HEPARIN (PORCINE) IN NACL 1000-0.9 UT/500ML-% IV SOLN
INTRAVENOUS | Status: AC
  Filled 2022-12-19: qty 1000

## 2022-12-19 MED ORDER — VERAPAMIL HCL 2.5 MG/ML IV SOLN
INTRAVENOUS | Status: AC
  Filled 2022-12-19: qty 2

## 2022-12-19 MED ORDER — ASPIRIN 300 MG RE SUPP
300.0000 mg | Freq: Once | RECTAL | Status: AC
  Administered 2022-12-20: 300 mg via RECTAL
  Filled 2022-12-19: qty 1

## 2022-12-19 MED ORDER — MIDAZOLAM HCL 2 MG/2ML IJ SOLN
INTRAMUSCULAR | Status: AC
  Filled 2022-12-19: qty 2

## 2022-12-19 MED ORDER — FENTANYL CITRATE (PF) 100 MCG/2ML IJ SOLN
INTRAMUSCULAR | Status: AC
  Filled 2022-12-19: qty 2

## 2022-12-19 MED ORDER — HEPARIN SODIUM (PORCINE) 5000 UNIT/ML IJ SOLN
4000.0000 [IU] | Freq: Once | INTRAMUSCULAR | Status: AC
  Administered 2022-12-20: 4000 [IU] via INTRAVENOUS

## 2022-12-19 MED ORDER — HEPARIN SODIUM (PORCINE) 1000 UNIT/ML IJ SOLN
INTRAMUSCULAR | Status: AC
  Filled 2022-12-19: qty 10

## 2022-12-19 MED ORDER — EPINEPHRINE HCL 5 MG/250ML IV SOLN IN NS
0.5000 ug/min | INTRAVENOUS | Status: DC
  Administered 2022-12-20: 2 ug/min via INTRAVENOUS
  Filled 2022-12-19: qty 250

## 2022-12-19 NOTE — ED Notes (Signed)
Levo rate changed to 18mg/min

## 2022-12-19 NOTE — ED Notes (Addendum)
2315: CPR started with asytole upon arrival to ED 2317: '1mg'$  Epi given 2320: Pulse check with asystole and '1mg'$  Epi given 2323: Pulse check with Vtach- pt was synchronized cardioverted at 200J  2324: ST 106 2325: '150mg'$  Amnioadarone given 2328: '150mg'$  Amnioadarone given  2333: Norepi started at 13mg/min

## 2022-12-19 NOTE — ED Notes (Signed)
$'1mg'd$  Epi given

## 2022-12-19 NOTE — ED Notes (Signed)
Lost pulses- CPR initiated '1mg'$  Epi given

## 2022-12-19 NOTE — ED Triage Notes (Signed)
EMS call out at 2214 for unresponsiveness, ems arrived and pt had been doing cocaine and was in PEA when ems arrived, cpr was started, pt went into asystole with ems and 4 total epi's given pta.

## 2022-12-19 NOTE — ED Notes (Signed)
[facility]

## 2022-12-20 ENCOUNTER — Inpatient Hospital Stay: Payer: 59

## 2022-12-20 ENCOUNTER — Encounter: Payer: Self-pay | Admitting: Student in an Organized Health Care Education/Training Program

## 2022-12-20 ENCOUNTER — Emergency Department: Payer: 59

## 2022-12-20 DIAGNOSIS — Z86010 Personal history of colonic polyps: Secondary | ICD-10-CM | POA: Diagnosis not present

## 2022-12-20 DIAGNOSIS — Z66 Do not resuscitate: Secondary | ICD-10-CM | POA: Diagnosis not present

## 2022-12-20 DIAGNOSIS — I213 ST elevation (STEMI) myocardial infarction of unspecified site: Secondary | ICD-10-CM

## 2022-12-20 DIAGNOSIS — G936 Cerebral edema: Secondary | ICD-10-CM

## 2022-12-20 DIAGNOSIS — J690 Pneumonitis due to inhalation of food and vomit: Secondary | ICD-10-CM | POA: Diagnosis present

## 2022-12-20 DIAGNOSIS — Z955 Presence of coronary angioplasty implant and graft: Secondary | ICD-10-CM | POA: Diagnosis not present

## 2022-12-20 DIAGNOSIS — N179 Acute kidney failure, unspecified: Secondary | ICD-10-CM | POA: Diagnosis not present

## 2022-12-20 DIAGNOSIS — R578 Other shock: Secondary | ICD-10-CM

## 2022-12-20 DIAGNOSIS — E876 Hypokalemia: Secondary | ICD-10-CM | POA: Diagnosis present

## 2022-12-20 DIAGNOSIS — Z9852 Vasectomy status: Secondary | ICD-10-CM | POA: Diagnosis not present

## 2022-12-20 DIAGNOSIS — F1490 Cocaine use, unspecified, uncomplicated: Secondary | ICD-10-CM | POA: Diagnosis present

## 2022-12-20 DIAGNOSIS — I469 Cardiac arrest, cause unspecified: Secondary | ICD-10-CM

## 2022-12-20 DIAGNOSIS — Z7189 Other specified counseling: Secondary | ICD-10-CM

## 2022-12-20 DIAGNOSIS — G931 Anoxic brain damage, not elsewhere classified: Secondary | ICD-10-CM | POA: Diagnosis present

## 2022-12-20 DIAGNOSIS — I2510 Atherosclerotic heart disease of native coronary artery without angina pectoris: Secondary | ICD-10-CM | POA: Diagnosis present

## 2022-12-20 DIAGNOSIS — J9601 Acute respiratory failure with hypoxia: Secondary | ICD-10-CM | POA: Diagnosis present

## 2022-12-20 DIAGNOSIS — I10 Essential (primary) hypertension: Secondary | ICD-10-CM | POA: Diagnosis present

## 2022-12-20 DIAGNOSIS — E872 Acidosis, unspecified: Secondary | ICD-10-CM | POA: Diagnosis present

## 2022-12-20 DIAGNOSIS — Z515 Encounter for palliative care: Secondary | ICD-10-CM | POA: Diagnosis not present

## 2022-12-20 DIAGNOSIS — Z79899 Other long term (current) drug therapy: Secondary | ICD-10-CM | POA: Diagnosis not present

## 2022-12-20 DIAGNOSIS — E785 Hyperlipidemia, unspecified: Secondary | ICD-10-CM | POA: Diagnosis present

## 2022-12-20 DIAGNOSIS — J9602 Acute respiratory failure with hypercapnia: Secondary | ICD-10-CM | POA: Diagnosis present

## 2022-12-20 DIAGNOSIS — I252 Old myocardial infarction: Secondary | ICD-10-CM | POA: Diagnosis not present

## 2022-12-20 DIAGNOSIS — Z7982 Long term (current) use of aspirin: Secondary | ICD-10-CM | POA: Diagnosis not present

## 2022-12-20 DIAGNOSIS — R739 Hyperglycemia, unspecified: Secondary | ICD-10-CM | POA: Diagnosis present

## 2022-12-20 DIAGNOSIS — N170 Acute kidney failure with tubular necrosis: Secondary | ICD-10-CM | POA: Diagnosis present

## 2022-12-20 DIAGNOSIS — G253 Myoclonus: Secondary | ICD-10-CM | POA: Diagnosis not present

## 2022-12-20 LAB — BLOOD GAS, ARTERIAL
Acid-base deficit: 16.6 mmol/L — ABNORMAL HIGH (ref 0.0–2.0)
Acid-base deficit: 5.4 mmol/L — ABNORMAL HIGH (ref 0.0–2.0)
Bicarbonate: 13.8 mmol/L — ABNORMAL LOW (ref 20.0–28.0)
Bicarbonate: 20 mmol/L (ref 20.0–28.0)
FIO2: 100 %
FIO2: 100 %
MECHVT: 500 mL
MECHVT: 600 mL
Mechanical Rate: 20
Mechanical Rate: 20
O2 Saturation: 100 %
O2 Saturation: 96.6 %
PEEP: 5 cmH2O
PEEP: 5 cmH2O
Patient temperature: 37
Patient temperature: 37
pCO2 arterial: 38 mmHg (ref 32–48)
pCO2 arterial: 50 mmHg — ABNORMAL HIGH (ref 32–48)
pH, Arterial: 7.05 — CL (ref 7.35–7.45)
pH, Arterial: 7.33 — ABNORMAL LOW (ref 7.35–7.45)
pO2, Arterial: 492 mmHg — ABNORMAL HIGH (ref 83–108)
pO2, Arterial: 96 mmHg (ref 83–108)

## 2022-12-20 LAB — TROPONIN I (HIGH SENSITIVITY)
Troponin I (High Sensitivity): 2832 ng/L (ref <18)
Troponin I (High Sensitivity): 2854 ng/L (ref <18)
Troponin I (High Sensitivity): 368 ng/L (ref <18)
Troponin I (High Sensitivity): 71 ng/L — ABNORMAL HIGH (ref <18)

## 2022-12-20 LAB — COMPREHENSIVE METABOLIC PANEL
ALT: 339 U/L — ABNORMAL HIGH (ref 0–44)
AST: 309 U/L — ABNORMAL HIGH (ref 15–41)
Albumin: 4.3 g/dL (ref 3.5–5.0)
Alkaline Phosphatase: 101 U/L (ref 38–126)
Anion gap: 20 — ABNORMAL HIGH (ref 5–15)
BUN: 22 mg/dL (ref 8–23)
CO2: 18 mmol/L — ABNORMAL LOW (ref 22–32)
Calcium: 8.7 mg/dL — ABNORMAL LOW (ref 8.9–10.3)
Chloride: 96 mmol/L — ABNORMAL LOW (ref 98–111)
Creatinine, Ser: 1.79 mg/dL — ABNORMAL HIGH (ref 0.61–1.24)
GFR, Estimated: 43 mL/min — ABNORMAL LOW (ref >60)
Glucose, Bld: 339 mg/dL — ABNORMAL HIGH (ref 70–99)
Potassium: 2.8 mmol/L — ABNORMAL LOW (ref 3.5–5.1)
Sodium: 134 mmol/L — ABNORMAL LOW (ref 135–145)
Total Bilirubin: 1.3 mg/dL — ABNORMAL HIGH (ref 0.3–1.2)
Total Protein: 7.1 g/dL (ref 6.5–8.1)

## 2022-12-20 LAB — CBC WITH DIFFERENTIAL/PLATELET
Abs Immature Granulocytes: 0.76 10*3/uL — ABNORMAL HIGH (ref 0.00–0.07)
Basophils Absolute: 0.1 10*3/uL (ref 0.0–0.1)
Basophils Relative: 1 %
Eosinophils Absolute: 0.2 10*3/uL (ref 0.0–0.5)
Eosinophils Relative: 1 %
HCT: 50 % (ref 39.0–52.0)
Hemoglobin: 16.3 g/dL (ref 13.0–17.0)
Immature Granulocytes: 5 %
Lymphocytes Relative: 45 %
Lymphs Abs: 6.5 10*3/uL — ABNORMAL HIGH (ref 0.7–4.0)
MCH: 29.8 pg (ref 26.0–34.0)
MCHC: 32.6 g/dL (ref 30.0–36.0)
MCV: 91.4 fL (ref 80.0–100.0)
Monocytes Absolute: 0.6 10*3/uL (ref 0.1–1.0)
Monocytes Relative: 4 %
Neutro Abs: 6.3 10*3/uL (ref 1.7–7.7)
Neutrophils Relative %: 44 %
Platelets: 332 10*3/uL (ref 150–400)
RBC: 5.47 MIL/uL (ref 4.22–5.81)
RDW: 12.4 % (ref 11.5–15.5)
WBC: 14.5 10*3/uL — ABNORMAL HIGH (ref 4.0–10.5)
nRBC: 0.3 % — ABNORMAL HIGH (ref 0.0–0.2)

## 2022-12-20 LAB — CBC
HCT: 46.5 % (ref 39.0–52.0)
Hemoglobin: 16 g/dL (ref 13.0–17.0)
MCH: 29.4 pg (ref 26.0–34.0)
MCHC: 34.4 g/dL (ref 30.0–36.0)
MCV: 85.3 fL (ref 80.0–100.0)
Platelets: 374 10*3/uL (ref 150–400)
RBC: 5.45 MIL/uL (ref 4.22–5.81)
RDW: 12.5 % (ref 11.5–15.5)
WBC: 18.6 10*3/uL — ABNORMAL HIGH (ref 4.0–10.5)
nRBC: 0 % (ref 0.0–0.2)

## 2022-12-20 LAB — APTT: aPTT: 37 seconds — ABNORMAL HIGH (ref 24–36)

## 2022-12-20 LAB — URINE DRUG SCREEN, QUALITATIVE (ARMC ONLY)
Amphetamines, Ur Screen: NOT DETECTED
Barbiturates, Ur Screen: NOT DETECTED
Benzodiazepine, Ur Scrn: NOT DETECTED
Cannabinoid 50 Ng, Ur ~~LOC~~: NOT DETECTED
Cocaine Metabolite,Ur ~~LOC~~: POSITIVE — AB
MDMA (Ecstasy)Ur Screen: NOT DETECTED
Methadone Scn, Ur: NOT DETECTED
Opiate, Ur Screen: NOT DETECTED
Phencyclidine (PCP) Ur S: NOT DETECTED
Tricyclic, Ur Screen: NOT DETECTED

## 2022-12-20 LAB — BASIC METABOLIC PANEL
Anion gap: 18 — ABNORMAL HIGH (ref 5–15)
BUN: 25 mg/dL — ABNORMAL HIGH (ref 8–23)
CO2: 19 mmol/L — ABNORMAL LOW (ref 22–32)
Calcium: 8.6 mg/dL — ABNORMAL LOW (ref 8.9–10.3)
Chloride: 98 mmol/L (ref 98–111)
Creatinine, Ser: 1.51 mg/dL — ABNORMAL HIGH (ref 0.61–1.24)
GFR, Estimated: 52 mL/min — ABNORMAL LOW (ref >60)
Glucose, Bld: 258 mg/dL — ABNORMAL HIGH (ref 70–99)
Potassium: 3.3 mmol/L — ABNORMAL LOW (ref 3.5–5.1)
Sodium: 135 mmol/L (ref 135–145)

## 2022-12-20 LAB — PROTIME-INR
INR: 1.3 — ABNORMAL HIGH (ref 0.8–1.2)
Prothrombin Time: 16.3 seconds — ABNORMAL HIGH (ref 11.4–15.2)

## 2022-12-20 LAB — LACTIC ACID, PLASMA
Lactic Acid, Venous: 8.7 mmol/L (ref 0.5–1.9)
Lactic Acid, Venous: 7.2 mmol/L (ref 0.5–1.9)
Lactic Acid, Venous: 2.9 mmol/L (ref 0.5–1.9)
Lactic Acid, Venous: 2 mmol/L (ref 0.5–1.9)

## 2022-12-20 LAB — LIPID PANEL
Cholesterol: 139 mg/dL (ref 0–200)
HDL: 41 mg/dL (ref >40)
LDL Cholesterol: 47 mg/dL (ref 0–99)
Total CHOL/HDL Ratio: 3.4 RATIO
Triglycerides: 256 mg/dL — ABNORMAL HIGH (ref <150)
VLDL: 51 mg/dL — ABNORMAL HIGH (ref 0–40)

## 2022-12-20 LAB — HIV ANTIBODY (ROUTINE TESTING W REFLEX): HIV Screen 4th Generation wRfx: NONREACTIVE

## 2022-12-20 LAB — GLUCOSE, CAPILLARY
Glucose-Capillary: 169 mg/dL — ABNORMAL HIGH (ref 70–99)
Glucose-Capillary: 176 mg/dL — ABNORMAL HIGH (ref 70–99)
Glucose-Capillary: 280 mg/dL — ABNORMAL HIGH (ref 70–99)
Glucose-Capillary: 313 mg/dL — ABNORMAL HIGH (ref 70–99)

## 2022-12-20 LAB — HEPARIN LEVEL (UNFRACTIONATED): Heparin Unfractionated: 0.26 IU/mL — ABNORMAL LOW (ref 0.30–0.70)

## 2022-12-20 LAB — PROCALCITONIN: Procalcitonin: 1.22 ng/mL

## 2022-12-20 LAB — PHOSPHORUS: Phosphorus: 4.4 mg/dL (ref 2.5–4.6)

## 2022-12-20 LAB — D-DIMER, QUANTITATIVE: D-Dimer, Quant: >20 ug/mL-FEU — ABNORMAL HIGH (ref 0.00–0.50)

## 2022-12-20 LAB — MRSA NEXT GEN BY PCR, NASAL: MRSA by PCR Next Gen: NOT DETECTED

## 2022-12-20 LAB — MAGNESIUM: Magnesium: 2.4 mg/dL (ref 1.7–2.4)

## 2022-12-20 MED ORDER — FENTANYL 2500MCG IN NS 250ML (10MCG/ML) PREMIX INFUSION: 25.0000 ug/h | INTRAVENOUS | Status: DC

## 2022-12-20 MED ORDER — LEVETIRACETAM IN NACL 1500 MG/100ML IV SOLN
1500.0000 mg | Freq: Once | INTRAVENOUS | Status: AC
  Administered 2022-12-20: 1500 mg via INTRAVENOUS
  Filled 2022-12-20: qty 100

## 2022-12-20 MED ORDER — MIDAZOLAM HCL 2 MG/2ML IJ SOLN
INTRAMUSCULAR | Status: AC
  Administered 2022-12-20: 2 mg
  Filled 2022-12-20: qty 2

## 2022-12-20 MED ORDER — NICARDIPINE HCL IN NACL 20-0.86 MG/200ML-% IV SOLN
3.0000 mg/h | INTRAVENOUS | Status: DC
  Administered 2022-12-20: 5 mg/h via INTRAVENOUS
  Filled 2022-12-20: qty 200

## 2022-12-20 MED ORDER — FENTANYL 2500MCG IN NS 250ML (10MCG/ML) PREMIX INFUSION
0.0000 ug/h | INTRAVENOUS | Status: DC
  Administered 2022-12-20: 50 ug/h via INTRAVENOUS

## 2022-12-20 MED ORDER — MIDAZOLAM BOLUS VIA INFUSION (WITHDRAWAL LIFE SUSTAINING TX): 2.0000 mg | INTRAVENOUS | Status: DC | PRN

## 2022-12-20 MED ORDER — HEPARIN (PORCINE) 25000 UT/250ML-% IV SOLN
1400.0000 [IU]/h | INTRAVENOUS | Status: DC
  Administered 2022-12-20: 1200 [IU]/h via INTRAVENOUS
  Filled 2022-12-20: qty 250

## 2022-12-20 MED ORDER — SODIUM BICARBONATE 8.4 % IV SOLN: 100.0000 meq | Freq: Once | INTRAVENOUS | Status: AC

## 2022-12-20 MED ORDER — FAMOTIDINE 20 MG PO TABS
20.0000 mg | ORAL_TABLET | Freq: Two times a day (BID) | ORAL | Status: DC
  Administered 2022-12-20: 20 mg
  Filled 2022-12-20: qty 1

## 2022-12-20 MED ORDER — ORAL CARE MOUTH RINSE: 15.0000 mL | OROMUCOSAL | Status: DC | PRN

## 2022-12-20 MED ORDER — SODIUM BICARBONATE 8.4 % IV SOLN
INTRAVENOUS | Status: AC
  Administered 2022-12-20: 100 meq via INTRAVENOUS
  Filled 2022-12-20: qty 100

## 2022-12-20 MED ORDER — MIDAZOLAM-SODIUM CHLORIDE 100-0.9 MG/100ML-% IV SOLN
0.0000 mg/h | INTRAVENOUS | Status: DC
  Administered 2022-12-20: 2 mg/h via INTRAVENOUS
  Filled 2022-12-20: qty 100

## 2022-12-20 MED ORDER — IOHEXOL 350 MG/ML SOLN
75.0000 mL | Freq: Once | INTRAVENOUS | Status: AC | PRN
  Administered 2022-12-20: 75 mL via INTRAVENOUS

## 2022-12-20 MED ORDER — FENTANYL BOLUS VIA INFUSION: 25.0000 ug | INTRAVENOUS | Status: DC | PRN

## 2022-12-20 MED ORDER — HYDRALAZINE HCL 20 MG/ML IJ SOLN: 5.0000 mg | INTRAMUSCULAR | Status: DC | PRN

## 2022-12-20 MED ORDER — MIDAZOLAM BOLUS VIA INFUSION: 0.0000 mg | INTRAVENOUS | Status: DC | PRN

## 2022-12-20 MED ORDER — NOREPINEPHRINE 4 MG/250ML-% IV SOLN: 0.0000 ug/min | INTRAVENOUS | Status: DC

## 2022-12-20 MED ORDER — HEPARIN BOLUS VIA INFUSION
1500.0000 [IU] | Freq: Once | INTRAVENOUS | Status: AC
  Administered 2022-12-20: 1500 [IU] via INTRAVENOUS
  Filled 2022-12-20: qty 1500

## 2022-12-20 MED ORDER — LEVETIRACETAM IN NACL 500 MG/100ML IV SOLN
500.0000 mg | Freq: Two times a day (BID) | INTRAVENOUS | Status: DC
  Administered 2022-12-20: 500 mg via INTRAVENOUS
  Filled 2022-12-20 (×2): qty 100

## 2022-12-20 MED ORDER — POLYETHYLENE GLYCOL 3350 17 G PO PACK: 17.0000 g | PACK | Freq: Every day | ORAL | Status: DC | PRN

## 2022-12-20 MED ORDER — SODIUM CHLORIDE 0.9 % IV SOLN
3.0000 g | Freq: Four times a day (QID) | INTRAVENOUS | Status: DC
  Administered 2022-12-20: 3 g via INTRAVENOUS
  Filled 2022-12-20: qty 8
  Filled 2022-12-20: qty 3
  Filled 2022-12-20 (×2): qty 8

## 2022-12-20 MED ORDER — POTASSIUM CHLORIDE 10 MEQ/50ML IV SOLN
10.0000 meq | INTRAVENOUS | Status: AC
  Administered 2022-12-20 (×4): 10 meq via INTRAVENOUS
  Filled 2022-12-20 (×4): qty 50

## 2022-12-20 MED ORDER — MIDAZOLAM HCL 2 MG/2ML IJ SOLN
1.0000 mg | INTRAMUSCULAR | Status: DC | PRN
  Administered 2022-12-20: 1 mg via INTRAVENOUS

## 2022-12-20 MED ORDER — NOREPINEPHRINE 4 MG/250ML-% IV SOLN
INTRAVENOUS | Status: AC
  Administered 2022-12-20: 7 ug/min via INTRAVENOUS
  Filled 2022-12-20: qty 250

## 2022-12-20 MED ORDER — LACTATED RINGERS IV BOLUS: 1000.0000 mL | Freq: Once | INTRAVENOUS | Status: DC

## 2022-12-20 MED ORDER — FENTANYL CITRATE PF 50 MCG/ML IJ SOSY: 25.0000 ug | PREFILLED_SYRINGE | Freq: Once | INTRAMUSCULAR | Status: DC

## 2022-12-20 MED ORDER — HYDRALAZINE HCL 20 MG/ML IJ SOLN
INTRAMUSCULAR | Status: AC
  Administered 2022-12-20: 5 mg via INTRAVENOUS
  Filled 2022-12-20: qty 1

## 2022-12-20 MED ORDER — INSULIN ASPART 100 UNIT/ML IJ SOLN
0.0000 [IU] | INTRAMUSCULAR | Status: DC
  Administered 2022-12-20: 5 [IU] via SUBCUTANEOUS
  Administered 2022-12-20 (×2): 2 [IU] via SUBCUTANEOUS
  Filled 2022-12-20 (×3): qty 1

## 2022-12-20 MED ORDER — DOCUSATE SODIUM 100 MG PO CAPS: 100.0000 mg | ORAL_CAPSULE | Freq: Two times a day (BID) | ORAL | Status: DC | PRN

## 2022-12-20 MED ORDER — FENTANYL 2500MCG IN NS 250ML (10MCG/ML) PREMIX INFUSION
INTRAVENOUS | Status: AC
  Administered 2022-12-20: 50 ug/h via INTRAVENOUS
  Filled 2022-12-20: qty 250

## 2022-12-20 MED ORDER — ORAL CARE MOUTH RINSE
15.0000 mL | OROMUCOSAL | Status: DC
  Administered 2022-12-20 (×6): 15 mL via OROMUCOSAL

## 2022-12-20 MED ORDER — FENTANYL BOLUS VIA INFUSION: 100.0000 ug | INTRAVENOUS | Status: DC | PRN

## 2022-12-22 LAB — HEMOGLOBIN A1C
Hgb A1c MFr Bld: 5.9 % — ABNORMAL HIGH (ref 4.8–5.6)
Mean Plasma Glucose: 123 mg/dL

## 2022-12-28 NOTE — Procedures (Signed)
Central Venous Catheter Insertion Procedure Note  Sergio King  [id number]  1961/02/03  Date:01-07-2023  Time:1:58 AM   Provider Performing:Kaeli Nichelson A Amberlin Utke   Procedure: Insertion of Non-tunneled Central Venous Catheter(36556) with US guidance (83729)   Indication(s) Medication administration and Difficult access  Consent Unable to obtain consent due to emergent nature of procedure.  Anesthesia Topical only with 1% lidocaine   Timeout Verified patient identification, verified procedure, site/side was marked, verified correct patient position, special equipment/implants available, medications/allergies/relevant history reviewed, required imaging and test results available.  Sterile Technique Maximal sterile technique including full sterile barrier drape, hand hygiene, sterile gown, sterile gloves, mask, hair covering, sterile ultrasound probe cover (if used).  Procedure Description Area of catheter insertion was cleaned with chlorhexidine and draped in sterile fashion.  With real-time ultrasound guidance a central venous catheter was placed into the right internal jugular vein. Nonpulsatile blood flow and easy flushing noted in all ports.  The catheter was sutured in place and sterile dressing applied.  Complications/Tolerance None; patient tolerated the procedure well. Chest X-ray is ordered to verify placement for internal jugular or subclavian cannulation.   Chest x-ray is not ordered for femoral cannulation.  EBL Minimal  Specimen(s) None  [name], DNP, CCRN, FNP-C, AGACNP-BC Acute Care & Family Nurse Practitioner  [facility]  See Amion for personal pager PCCM on call pager [phone] until 7 am

## 2022-12-28 NOTE — Progress Notes (Signed)
   01-13-2023 1600  Clinical Encounter Type  Visited With Patient and family together  Visit Type Follow-up;Patient actively dying  Referral From Nurse  Consult/Referral To [place] Prayer;Grief support   Chaplain called to bedside to provide support to family pre extubation and EOL. Chaplain asked to pray by family and provide comfort to mother of patient.

## 2022-12-28 NOTE — Consult Note (Signed)
Sergio King is a 62 y.o. male  [id number]  Primary Cardiologist: Neoma Laming Reason for Consultation: Myocardial infarction  HPI: This is a 62 year old white male with a past medical history of PCI and stenting of the LAD and diagonal at [facility] presented with inferior STEMI probably involving right ventricle and due to prolonged downtime did not undergo intervention.  Patient apparently had CPR for over 90 minutes and Dr. Fletcher Anon saw the patient and decided against intervention.  Patient is intubated and unresponsive.   Review of Systems: Unable to get any history   Past Medical History:  Diagnosis Date   Hammertoe of left foot    Hypertension     Medications Prior to Admission  Medication Sig Dispense Refill   amLODipine (NORVASC) 10 MG tablet TAKE 1 TABLET BY MOUTH EVERY DAY 90 tablet 1   aspirin EC 81 MG tablet Take 81 mg by mouth daily.     atenolol-chlorthalidone (TENORETIC) 50-25 MG tablet TAKE 1/2 TABLET BY MOUTH EVERY DAY 45 tablet 4   atorvastatin (LIPITOR) 80 MG tablet Take 1 tablet (80 mg total) by mouth daily. 90 tablet 2   benazepril-hydrochlorthiazide (LOTENSIN HCT) 20-25 MG tablet TAKE 1 TABLET BY MOUTH EVERY DAY 90 tablet 4   BIOTIN PO Take by mouth.     citalopram (CELEXA) 20 MG tablet TAKE 1 TABLET BY MOUTH EVERY DAY 90 tablet 4   Coenzyme Q10 (COQ10 PO) Take by mouth.     fluconazole (DIFLUCAN) 150 MG tablet TAKE 1 TABLET BY MOUTH WEEKLY AS NEEDED 4 tablet 5   GLUCOSAMINE-CHONDROITIN PO Take by mouth.     ketoconazole (NIZORAL) 2 % cream APPLY TOPICALLY DAILY AS NEEDED FOR IRRITATION. APPLY EXTERNALLY AS NEEDED 30 g 3   LORazepam (ATIVAN) 0.5 MG tablet TAKE 1 TABLET BY MOUTH EVERY NIGHT AT BEDTIME 30 tablet 5   Multiple Vitamin (MULTIVITAMIN) tablet Take 1 tablet by mouth daily.     Omega-3 Fatty Acids (FISH OIL PO) Take by mouth.     sildenafil (VIAGRA) 50 MG tablet TAKE 1 TABLET BY MOUTH DAILY AS NEEDED FOR ERECTILE DYSFUNCTION. 6 tablet 5       famotidine  20 mg Per Tube BID   fentaNYL (SUBLIMAZE) injection  25 mcg Intravenous Once   heparin  1,500 Units Intravenous Once   insulin aspart  0-9 Units Subcutaneous Q4H   mouth rinse  15 mL Mouth Rinse Q2H    Infusions:  sodium chloride Stopped (01/13/2023 0226)   ampicillin-sulbactam (UNASYN) IV     epinephrine Stopped (01/13/2023 0629)   fentaNYL infusion INTRAVENOUS 200 mcg/hr (Jan 13, 2023 5790)   heparin 1,200 Units/hr (01-13-2023 3833)   lactated ringers Stopped (01-13-2023 3832)   levETIRAcetam     midazolam 9 mg/hr (01-13-23 0833)   niCARDipine 5 mg/hr (01/13/2023 0841)   norepinephrine (LEVOPHED) Adult infusion Stopped (01-13-2023 0713)    No Known Allergies  Social History   Socioeconomic History   Marital status: Divorced    Spouse name: Not on file   Number of children: 2   Years of education: Not on file   Highest education level: Not on file  Occupational History   Occupation: Employed    Employer: [employer]    Comment: full time  Tobacco Use   Smoking status: Never   Smokeless tobacco: Never  Vaping Use   Vaping Use: Never used  Substance and Sexual Activity   Alcohol use: Yes    Alcohol/week: 18.0 standard drinks of  alcohol    Types: 18 Standard drinks or equivalent per week    Comment: 1 case of beer per week (18-24 beers)   Drug use: No   Sexual activity: Not on file  Other Topics Concern   Not on file  Social History Narrative   Not on file   Social Determinants of Health   Financial Resource Strain: Not on file  Food Insecurity: Not on file  Transportation Needs: Not on file  Physical Activity: Not on file  Stress: Not on file  Social Connections: Not on file  Intimate Partner Violence: Not on file    Family History  Problem Relation Age of Onset   CAD Father     PHYSICAL EXAM: Vitals:   2022-12-21 0745 2022-12-21 1100  BP: 137/75 (!) 153/129  Pulse: 74 (!) 59  Resp: 20 20  Temp: 98.1 F (36.7 C) (!) 97.2 F (36.2 C)  SpO2:  98% 96%     Intake/Output Summary (Last 24 hours) at 12/21/22 1131 Last data filed at 12/21/2022 2505 Gross per 24 hour  Intake 652.23 ml  Output 1050 ml  Net -397.77 ml    General:  Well appearing. No respiratory difficulty HEENT: normal Neck: supple. no JVD. Carotids 2+ bilat; no bruits. No lymphadenopathy or thryomegaly appreciated. Cor: PMI nondisplaced. Regular rate & rhythm. No rubs, gallops or murmurs. Lungs: clear Abdomen: soft, nontender, nondistended. No hepatosplenomegaly. No bruits or masses. Good bowel sounds. Extremities: no cyanosis, clubbing, rash, edema Neuro: alert & oriented x 3, cranial nerves grossly intact. moves all 4 extremities w/o difficulty. Affect pleasant.  ECG: Normal sinus rhythm with ST elevation in lead III with ST elevation in lead V1 1 to 2 mm and ST depression V2 to V6 with poor R wave progression.  Results for orders placed or performed during the hospital encounter of 12/10/2022 (from the past 24 hour(s))  CBC with Differential/Platelet     Status: Abnormal   Collection Time: 12/02/2022 11:50 PM  Result Value Ref Range   WBC 14.5 (H) 4.0 - 10.5 K/uL   RBC 5.47 4.22 - 5.81 MIL/uL   Hemoglobin 16.3 13.0 - 17.0 g/dL   HCT 50.0 39.0 - 52.0 %   MCV 91.4 80.0 - 100.0 fL   MCH 29.8 26.0 - 34.0 pg   MCHC 32.6 30.0 - 36.0 g/dL   RDW 12.4 11.5 - 15.5 %   Platelets 332 150 - 400 K/uL   nRBC 0.3 (H) 0.0 - 0.2 %   Neutrophils Relative % 44 %   Neutro Abs 6.3 1.7 - 7.7 K/uL   Lymphocytes Relative 45 %   Lymphs Abs 6.5 (H) 0.7 - 4.0 K/uL   Monocytes Relative 4 %   Monocytes Absolute 0.6 0.1 - 1.0 K/uL   Eosinophils Relative 1 %   Eosinophils Absolute 0.2 0.0 - 0.5 K/uL   Basophils Relative 1 %   Basophils Absolute 0.1 0.0 - 0.1 K/uL   Immature Granulocytes 5 %   Abs Immature Granulocytes 0.76 (H) 0.00 - 0.07 K/uL  Protime-INR     Status: Abnormal   Collection Time: 12/18/2022 11:50 PM  Result Value Ref Range   Prothrombin Time 16.3 (H) 11.4 -  15.2 seconds   INR 1.3 (H) 0.8 - 1.2  APTT     Status: Abnormal   Collection Time: 12/03/2022 11:50 PM  Result Value Ref Range   aPTT 37 (H) 24 - 36 seconds  Comprehensive metabolic panel     Status: Abnormal  Collection Time: 12/24/2022 11:50 PM  Result Value Ref Range   Sodium 134 (L) 135 - 145 mmol/L   Potassium 2.8 (L) 3.5 - 5.1 mmol/L   Chloride 96 (L) 98 - 111 mmol/L   CO2 18 (L) 22 - 32 mmol/L   Glucose, Bld 339 (H) 70 - 99 mg/dL   BUN 22 8 - 23 mg/dL   Creatinine, Ser 1.79 (H) 0.61 - 1.24 mg/dL   Calcium 8.7 (L) 8.9 - 10.3 mg/dL   Total Protein 7.1 6.5 - 8.1 g/dL   Albumin 4.3 3.5 - 5.0 g/dL   AST 309 (H) 15 - 41 U/L   ALT 339 (H) 0 - 44 U/L   Alkaline Phosphatase 101 38 - 126 U/L   Total Bilirubin 1.3 (H) 0.3 - 1.2 mg/dL   GFR, Estimated 43 (L) >60 mL/min   Anion gap 20 (H) 5 - 15  Troponin I (High Sensitivity)     Status: Abnormal   Collection Time: 12/02/2022 11:50 PM  Result Value Ref Range   Troponin I (High Sensitivity) 71 (H) <18 ng/L  Lipid panel     Status: Abnormal   Collection Time: 12/22/2022 11:50 PM  Result Value Ref Range   Cholesterol 139 0 - 200 mg/dL   Triglycerides 256 (H) <150 mg/dL   HDL 41 >40 mg/dL   Total CHOL/HDL Ratio 3.4 RATIO   VLDL 51 (H) 0 - 40 mg/dL   LDL Cholesterol 47 0 - 99 mg/dL  Blood gas, arterial     Status: Abnormal   Collection Time: 11/28/2022 11:54 PM  Result Value Ref Range   FIO2 100 %   MECHVT 500 mL   PEEP 5 cm H20   pH, Arterial 7.05 (LL) 7.35 - 7.45   pCO2 arterial 50 (H) 32 - 48 mmHg   pO2, Arterial 96 83 - 108 mmHg   Bicarbonate 13.8 (L) 20.0 - 28.0 mmol/L   Acid-base deficit 16.6 (H) 0.0 - 2.0 mmol/L   O2 Saturation 96.6 %   Patient temperature 37.0    Collection site RIGHT RADIAL    Allens test (pass/fail) PASS PASS   Mechanical Rate 20   Urine Drug Screen, Qualitative ([facility] only)     Status: Abnormal   Collection Time: Jan 17, 2023 12:16 AM  Result Value Ref Range   Tricyclic, Ur Screen NONE DETECTED NONE  DETECTED   Amphetamines, Ur Screen NONE DETECTED NONE DETECTED   MDMA (Ecstasy)Ur Screen NONE DETECTED NONE DETECTED   Cocaine Metabolite,Ur [place] POSITIVE (A) NONE DETECTED   Opiate, Ur Screen NONE DETECTED NONE DETECTED   Phencyclidine (PCP) Ur S NONE DETECTED NONE DETECTED   Cannabinoid 50 Ng, Ur [place] NONE DETECTED NONE DETECTED   Barbiturates, Ur Screen NONE DETECTED NONE DETECTED   Benzodiazepine, Ur Scrn NONE DETECTED NONE DETECTED   Methadone Scn, Ur NONE DETECTED NONE DETECTED  Glucose, capillary     Status: Abnormal   Collection Time: January 17, 2023 12:46 AM  Result Value Ref Range   Glucose-Capillary 313 (H) 70 - 99 mg/dL  Lactic acid, plasma     Status: Abnormal   Collection Time: 01/17/2023  1:30 AM  Result Value Ref Range   Lactic Acid, Venous 8.7 (HH) 0.5 - 1.9 mmol/L  D-dimer, quantitative     Status: Abnormal   Collection Time: 01/17/23  1:30 AM  Result Value Ref Range   D-Dimer, Quant >20.00 (H) 0.00 - 0.50 ug/mL-FEU  Troponin I (High Sensitivity)     Status: Abnormal   Collection  Time: 2023/01/15  1:30 AM  Result Value Ref Range   Troponin I (High Sensitivity) 368 (HH) <18 ng/L  MRSA Next Gen by PCR, Nasal     Status: None   Collection Time: Jan 15, 2023  2:45 AM   Specimen: Nasal Mucosa; Nasal Swab  Result Value Ref Range   MRSA by PCR Next Gen NOT DETECTED NOT DETECTED  Glucose, capillary     Status: Abnormal   Collection Time: Jan 15, 2023  3:07 AM  Result Value Ref Range   Glucose-Capillary 280 (H) 70 - 99 mg/dL  Magnesium     Status: None   Collection Time: 2023/01/15  3:09 AM  Result Value Ref Range   Magnesium 2.4 1.7 - 2.4 mg/dL  Phosphorus     Status: None   Collection Time: 01-15-23  3:09 AM  Result Value Ref Range   Phosphorus 4.4 2.5 - 4.6 mg/dL  Basic metabolic panel     Status: Abnormal   Collection Time: 15-Jan-2023  3:09 AM  Result Value Ref Range   Sodium 135 135 - 145 mmol/L   Potassium 3.3 (L) 3.5 - 5.1 mmol/L   Chloride 98 98 - 111 mmol/L   CO2 19 (L) 22 -  32 mmol/L   Glucose, Bld 258 (H) 70 - 99 mg/dL   BUN 25 (H) 8 - 23 mg/dL   Creatinine, Ser 1.51 (H) 0.61 - 1.24 mg/dL   Calcium 8.6 (L) 8.9 - 10.3 mg/dL   GFR, Estimated 52 (L) >60 mL/min   Anion gap 18 (H) 5 - 15  CBC     Status: Abnormal   Collection Time: 01-15-23  3:09 AM  Result Value Ref Range   WBC 18.6 (H) 4.0 - 10.5 K/uL   RBC 5.45 4.22 - 5.81 MIL/uL   Hemoglobin 16.0 13.0 - 17.0 g/dL   HCT 46.5 39.0 - 52.0 %   MCV 85.3 80.0 - 100.0 fL   MCH 29.4 26.0 - 34.0 pg   MCHC 34.4 30.0 - 36.0 g/dL   RDW 12.5 11.5 - 15.5 %   Platelets 374 150 - 400 K/uL   nRBC 0.0 0.0 - 0.2 %  Lactic acid, plasma     Status: Abnormal   Collection Time: January 15, 2023  3:09 AM  Result Value Ref Range   Lactic Acid, Venous 7.2 (HH) 0.5 - 1.9 mmol/L  HIV Antibody (routine testing w rflx)     Status: None   Collection Time: 01/15/23  3:09 AM  Result Value Ref Range   HIV Screen 4th Generation wRfx Non Reactive Non Reactive  Procalcitonin - Baseline     Status: None   Collection Time: 2023-01-15  3:09 AM  Result Value Ref Range   Procalcitonin 1.22 ng/mL  Blood gas, arterial     Status: Abnormal   Collection Time: 01/15/2023  4:12 AM  Result Value Ref Range   FIO2 100 %   Delivery systems VENTILATOR    MECHVT 600 mL   PEEP 5 cm H20   pH, Arterial 7.33 (L) 7.35 - 7.45   pCO2 arterial 38 32 - 48 mmHg   pO2, Arterial 492 (H) 83 - 108 mmHg   Bicarbonate 20.0 20.0 - 28.0 mmol/L   Acid-base deficit 5.4 (H) 0.0 - 2.0 mmol/L   O2 Saturation 100 %   Patient temperature 37.0    Collection site A-LINE    Allens test (pass/fail) PASS PASS   Mechanical Rate 20   Glucose, capillary     Status: Abnormal  Collection Time: 2023-01-19  7:34 AM  Result Value Ref Range   Glucose-Capillary 169 (H) 70 - 99 mg/dL  Lactic acid, plasma     Status: Abnormal   Collection Time: 01-19-23  8:43 AM  Result Value Ref Range   Lactic Acid, Venous 2.9 (HH) 0.5 - 1.9 mmol/L  Troponin I (High Sensitivity)     Status: Abnormal    Collection Time: 01-19-23  8:43 AM  Result Value Ref Range   Troponin I (High Sensitivity) 2,832 (HH) <18 ng/L  Heparin level (unfractionated)     Status: Abnormal   Collection Time: 01/19/2023  8:43 AM  Result Value Ref Range   Heparin Unfractionated 0.26 (L) 0.30 - 0.70 IU/mL   CT Angio Chest Pulmonary Embolism (PE) W or WO Contrast  Result Date: 2023/01/19 CLINICAL DATA:  Pulmonary embolism suspected, high probability EXAM: CT ANGIOGRAPHY CHEST WITH CONTRAST TECHNIQUE: Multidetector CT imaging of the chest was performed using the standard protocol during bolus administration of intravenous contrast. Multiplanar CT image reconstructions and MIPs were obtained to evaluate the vascular anatomy. RADIATION DOSE REDUCTION: This exam was performed according to the departmental dose-optimization program which includes automated exposure control, adjustment of the mA and/or kV according to patient size and/or use of iterative reconstruction technique. CONTRAST:  4m OMNIPAQUE IOHEXOL 350 MG/ML SOLN COMPARISON:  None Available. FINDINGS: Cardiovascular: Satisfactory opacification of the pulmonary arteries to the segmental level. No evidence of pulmonary embolism. Normal heart size. No pericardial effusion. Coronary atherosclerosis seen along the LAD and circumflex Mediastinum/Nodes: Negative Lungs/Pleura: Dependent atelectasis. No edema, effusion, or pneumothorax. Located endotracheal tube Upper Abdomen: No acute finding.  Located enteric tube Musculoskeletal: Bridging thoracic osteophytes. Review of the MIP images confirms the above findings. IMPRESSION: 1. Negative for pulmonary embolism. 2. Low volume chest with atelectasis. 3. Coronary atherosclerosis. Electronically Signed   By: JJorje GuildM.D.   On: 123-Jan-202409:46   CT HEAD WO CONTRAST (5MM)  Result Date: 101-23-24CLINICAL DATA:  Neuro deficit with stroke suspected.  Cardiac arrest EXAM: CT HEAD WITHOUT CONTRAST TECHNIQUE: Contiguous axial  images were obtained from the base of the skull through the vertex without intravenous contrast. RADIATION DOSE REDUCTION: This exam was performed according to the departmental dose-optimization program which includes automated exposure control, adjustment of the mA and/or kV according to patient size and/or use of iterative reconstruction technique. COMPARISON:  None Available. FINDINGS: Brain: Generalized cytotoxic edema in the cortex and cerebellum, also involving the deep gray nuclei symmetrically. Diffuse swelling with narrowing/effacement of sulci and crowding of the basal cisterns. No shift or transforaminal herniation. Vascular: Prominent vessels from brain edema. Skull: Normal. Negative for fracture or focal lesion. Sinuses/Orbits: No acute finding. IMPRESSION: Severe global anoxic injury with generalized brain swelling. Electronically Signed   By: JJorje GuildM.D.   On: 101/23/2409:23   [place]Venous Img Lower Bilateral (DVT)  Result Date: 101/23/24CLINICAL DATA:  Lower extremity edema. EXAM: BILATERAL LOWER EXTREMITY VENOUS DOPPLER ULTRASOUND TECHNIQUE: Gray-scale sonography with graded compression, as well as color Doppler and duplex ultrasound were performed to evaluate the lower extremity deep venous systems from the level of the common femoral vein and including the common femoral, femoral, profunda femoral, popliteal and calf veins including the posterior tibial, peroneal and gastrocnemius veins when visible. The superficial great saphenous vein was also interrogated. Spectral Doppler was utilized to evaluate flow at rest and with distal augmentation maneuvers in the common femoral, femoral and popliteal veins. COMPARISON:  None Available. FINDINGS: RIGHT LOWER  EXTREMITY Common Femoral Vein: No evidence of thrombus. Normal compressibility, respiratory phasicity and response to augmentation. Saphenofemoral Junction: No evidence of thrombus. Normal compressibility and flow on color Doppler  imaging. Profunda Femoral Vein: No evidence of thrombus. Normal compressibility and flow on color Doppler imaging. Femoral Vein: No evidence of thrombus. Normal compressibility, respiratory phasicity and response to augmentation. Popliteal Vein: No evidence of thrombus. Normal compressibility, respiratory phasicity and response to augmentation. Calf Veins: No evidence of thrombus. Normal compressibility and flow on color Doppler imaging. Superficial Great Saphenous Vein: No evidence of thrombus. Normal compressibility. Venous Reflux:  None. Other Findings: No evidence of superficial thrombophlebitis or abnormal fluid collection. LEFT LOWER EXTREMITY Common Femoral Vein: No evidence of thrombus. Normal compressibility, respiratory phasicity and response to augmentation. Saphenofemoral Junction: No evidence of thrombus. Normal compressibility and flow on color Doppler imaging. Profunda Femoral Vein: No evidence of thrombus. Normal compressibility and flow on color Doppler imaging. Femoral Vein: No evidence of thrombus. Normal compressibility, respiratory phasicity and response to augmentation. Popliteal Vein: No evidence of thrombus. Normal compressibility, respiratory phasicity and response to augmentation. Calf Veins: No evidence of thrombus. Normal compressibility and flow on color Doppler imaging. Superficial Great Saphenous Vein: No evidence of thrombus. Normal compressibility. Venous Reflux:  None. Other Findings: No evidence of superficial thrombophlebitis or abnormal fluid collection. IMPRESSION: No evidence of deep venous thrombosis in either lower extremity. Electronically Signed   By: Aletta Edouard M.D.   On: 2022/12/26 09:15   DG Chest 1 View  Result Date: 12/26/22 CLINICAL DATA:  NG tube placement and central venous catheter placement EXAM: CHEST  1 VIEW COMPARISON:  Radiographs 12/26/2022 at 12:06 a.m. FINDINGS: Endotracheal tube tip in the intrathoracic trachea 5.2 cm from the carina. Right IJ  CVC tip in the mid SVC. Subdiaphragmatic enteric tube. Defibrillator pads. Cardiomegaly. No focal consolidation, pleural effusion, or pneumothorax. The previous right perihilar/suprahilar density is no longer visualized. No acute osseous abnormality. Elevated right hemidiaphragm. IMPRESSION: Lines and tubes in good position. Cardiomegaly. Electronically Signed   By: Placido Sou M.D.   On: 12/26/2022 02:34   DG Abd Portable 1V  Result Date: 2022/12/26 CLINICAL DATA:  NG tube placement central venous catheter placement EXAM: PORTABLE ABDOMEN - 1 VIEW COMPARISON:  None Available. FINDINGS: Enteric tube tip and side port within the stomach. Defibrillator pad. Nonspecific bowel-gas pattern in the upper abdomen. IMPRESSION: NG tube in good position. Electronically Signed   By: Placido Sou M.D.   On: 12/26/22 02:22   DG Chest Port 1 View  Result Date: 2022-12-26 CLINICAL DATA:  Post intubation. EXAM: PORTABLE CHEST 1 VIEW COMPARISON:  Chest radiograph dated 10/14/2012. FINDINGS: Endotracheal tube with tip approximately 2.5 cm above the carina. There is shallow inspiration. Right perihilar and suprahilar density is suboptimally evaluated but may represent atelectasis or infiltrate. A hilar mass or adenopathy is not excluded. Further evaluation with CT is recommended. No pleural effusion or pneumothorax. Top-normal cardiac silhouette no acute osseous pathology. IMPRESSION: 1. Endotracheal tube above the carina. 2. Indeterminate right perihilar/suprahilar density. Further evaluation with CT is recommended. Electronically Signed   By: Anner Crete M.D.   On: 12/26/2022 00:28     ASSESSMENT AND PLAN: Status post STEMI most likely inferior wall MI with right ventricular involvement.  Patient has a prior history of LAD and diagonal stent at [facility].  Patient is normally followed by Dr. Clayborn Bigness.  He is unresponsive and intubated.  P.  He had prolonged CPR more than 90 minutes  and because of  extensive downtime did not go for intervention.  Thus patient was made DNR.  Will follow-up patient treat the patient conservatively with medical treatment.  Sergio King A

## 2022-12-28 NOTE — Progress Notes (Signed)
ANTICOAGULATION CONSULT NOTE - Initial Consult  Pharmacy Consult for Heparin  Indication: chest pain/ACS  No Known Allergies  Patient Measurements: Height: '5\' 11"'$  (180.3 cm) Weight: 106.5 kg (234 lb 12.6 oz) IBW/kg (Calculated) : 75.3 Heparin Dosing Weight: 99.9 kg   Vital Signs: Temp: 96.8 F (36 C) (12/24 1300) Temp Source: Bladder (12/24 0745) BP: 106/67 (12/24 1300) Pulse Rate: 57 (12/24 1300)  Labs: Recent Labs    11/28/2022 2350 December 31, 2022 0130 2022-12-31 0309 12-31-22 0843  HGB 16.3  --  16.0  --   HCT 50.0  --  46.5  --   PLT 332  --  374  --   APTT 37*  --   --   --   LABPROT 16.3*  --   --   --   INR 1.3*  --   --   --   HEPARINUNFRC  --   --   --  0.26*  CREATININE 1.79*  --  1.51*  --   TROPONINIHS 71* 368*  --  2,832*     Estimated Creatinine Clearance: 63.8 mL/min (A) (by C-G formula based on SCr of 1.51 mg/dL (H)).   Medical History: Past Medical History:  Diagnosis Date   Hammertoe of left foot    Hypertension     Medications:  Medications Prior to Admission  Medication Sig Dispense Refill Last Dose   amLODipine (NORVASC) 10 MG tablet TAKE 1 TABLET BY MOUTH EVERY DAY 90 tablet 1    aspirin EC 81 MG tablet Take 81 mg by mouth daily.      atenolol-chlorthalidone (TENORETIC) 50-25 MG tablet TAKE 1/2 TABLET BY MOUTH EVERY DAY 45 tablet 4    atorvastatin (LIPITOR) 80 MG tablet Take 1 tablet (80 mg total) by mouth daily. 90 tablet 2    benazepril-hydrochlorthiazide (LOTENSIN HCT) 20-25 MG tablet TAKE 1 TABLET BY MOUTH EVERY DAY 90 tablet 4    BIOTIN PO Take by mouth.      citalopram (CELEXA) 20 MG tablet TAKE 1 TABLET BY MOUTH EVERY DAY 90 tablet 4    Coenzyme Q10 (COQ10 PO) Take by mouth.      fluconazole (DIFLUCAN) 150 MG tablet TAKE 1 TABLET BY MOUTH WEEKLY AS NEEDED 4 tablet 5    GLUCOSAMINE-CHONDROITIN PO Take by mouth.      ketoconazole (NIZORAL) 2 % cream APPLY TOPICALLY DAILY AS NEEDED FOR IRRITATION. APPLY EXTERNALLY AS NEEDED 30 g 3     LORazepam (ATIVAN) 0.5 MG tablet TAKE 1 TABLET BY MOUTH EVERY NIGHT AT BEDTIME 30 tablet 5    Multiple Vitamin (MULTIVITAMIN) tablet Take 1 tablet by mouth daily.      Omega-3 Fatty Acids (FISH OIL PO) Take by mouth.      sildenafil (VIAGRA) 50 MG tablet TAKE 1 TABLET BY MOUTH DAILY AS NEEDED FOR ERECTILE DYSFUNCTION. 6 tablet 5     Assessment: Pharmacy consulted to dose heparin in this 62 year old male admitted with ACS/NSTEMI.  No prior anticoag noted. CrCl = 63.8 ml/min  Goal of Therapy:  Heparin level 0.3-0.7 units/ml Monitor platelets by anticoagulation protocol: Yes   Plan:  12/24'@0843'$ : HL 0.26, SUBtherapeutic  Bolus 1500 units x 1 Increase heparin infusion to 1400 units/hr Check anti-Xa level in 6 hours and daily while on heparin Continue to monitor H&H and platelets  Darius Lundberg A Adelisa Satterwhite 2022-12-31,1:42 PM

## 2022-12-28 NOTE — Progress Notes (Signed)
Vt adjusted to 8cc/kg

## 2022-12-28 NOTE — IPAL (Addendum)
  Interdisciplinary Goals of Care Family Meeting   Date carried out: 01-10-2023  Location of the meeting: Bedside, Conference room, phone call  Member's involved: Physician and Family Member or next of kin  Durable Power of Attorney or acting medical decision maker: Daughter and Son    Discussion: We discussed goals of care for [employer] .  Explained the overall condition with the cardiac arrest and prolonged down time of 90 minutes. Head CT with diffuse anoxic brain injury, physical exam consistent with herniation. Discussed with patients family (daughter, two brothers, other extended family members) at the bedside, and with the patient's son over the phone. They are understanding of his overall prognosis with no hope of any neurological recovery. They would like to proceed with withdrawal of care later today once the son has arrived to the bedside. They would like him to be DNR at the moment. We have also reached out to the organ bank and I have discussed his case with their medical director, Dr. Marlou Sa. Will continue supportive care at the moment.  Code status:   Code Status: DNR   Disposition: As above, DNR and continue supportive care.  Time spent for the meeting: Douglas, MD  2023/01/10, 11:18 AM  Addendum: Family at the bedside, ready to withdraw care at the moment. Orders placed, nurse updated. Will proceed with compassionate extubation and treat for comfort.  Armando Reichert, MD [facility] January 10, 2023 3:53 PM

## 2022-12-28 NOTE — Procedures (Signed)
Arterial Catheter Insertion Procedure Note  Sergio King  [id number]  1961/11/18  Date:10-Jan-2023  Time:1:57 AM   Provider Performing: Karen Kays   Procedure: Insertion of Arterial Line [id number]) with US guidance (29191)   Indication(s) Blood pressure monitoring and/or need for frequent ABGs  Consent Unable to obtain consent due to emergent nature of procedure.  Anesthesia None  Time Out Verified patient identification, verified procedure, site/side was marked, verified correct patient position, special equipment/implants available, medications/allergies/relevant history reviewed, required imaging and test results available.  Sterile Technique Maximal sterile technique including full sterile barrier drape, hand hygiene, sterile gown, sterile gloves, mask, hair covering, sterile ultrasound probe cover (if used).  Procedure Description Area of catheter insertion was cleaned with chlorhexidine and draped in sterile fashion. With real-time ultrasound guidance an arterial catheter was placed into the right radial artery.  Appropriate arterial tracings confirmed on monitor.    Complications/Tolerance None; patient tolerated the procedure well.  EBL Minimal  Specimen(s) None   Sergio Falco, DNP, CCRN, FNP-C, AGACNP-BC Acute Care & Family Nurse Practitioner  [facility]  See Amion for personal pager PCCM on call pager [phone] until 7 am

## 2022-12-28 NOTE — Consult Note (Signed)
Cardiology Consultation   Patient ID: BENJY KANA MRN: [id number]; DOB: 1961-12-04  Admit date: 11/27/2022 Date of Consult: 22-Dec-2022  PCP:  Birdie Sons, MD   [place] Providers Cardiologist:  Dr. Clayborn Bigness     Patient Profile:   Sergio King is a 62 y.o. male with a hx of coronary artery disease status post LAD stent in 2013 who is being seen 12/22/2022 for the evaluation of out of hospital cardiac arrest and possible myocardial infarction at the request of Dr. Karma Greaser.  History of Present Illness:   Sergio King is a 62 year old male with known history of coronary artery disease with LAD PCI and 2 stent placement in 2013, essential hypertension, hyperlipidemia and cocaine use. The patient was reportedly at the house where multiple people were having a party and allegedly using cocaine.  He reportedly went outside and then people noticed that he did not return.  After about 5 minutes they found him unresponsive outside.  They called EMS.  First responders and then paramedics arrived and found that he had a heart rate in the 30s with organized electrical activity (PEA) but no palpable pulse.  CPR was initiated and they did a total of 4 rounds of epinephrine with chest compressions and intubation with ET tube and good bilateral breath sounds.  They were able to get a return of spontaneous circulation with a palpable pulse but a heart rate that remained in the 30s, so they started a Levophed infusion and started external pacing.   However, when he arrived the ED he was in asystole and CPR was resumed.  He did have ventricular tachycardia at some point and was cardioverted to sinus tachycardia.  He continued to be hypotensive and was started on norepinephrine drip.  The patient is intubated and not responsive. Total CPR time was greater than 90 minutes.  I reviewed his EKGs, one of the EKGs was suggestive of inferior ST elevation myocardial infarction.  The most  recent EKG showed resolution of ST elevation.   Past Medical History:  Diagnosis Date   Hammertoe of left foot    Hypertension     Past Surgical History:  Procedure Laterality Date   CARDIAC CATHETERIZATION     2 stents placed. over 3 yrs ago. [facility]   COLONOSCOPY WITH PROPOFOL N/A 09/24/2017   Procedure: COLONOSCOPY WITH PROPOFOL;  Surgeon: Lucilla Lame, MD;  Location: [facility];  Service: Gastroenterology;  Laterality: N/A;   CORONARY STENT PLACEMENT Left 10/12/2012   99% LAD and 75% first diagonal [facility]   FOOT SURGERY Right 2007   Hammer toe surgery   POLYPECTOMY  09/24/2017   Procedure: POLYPECTOMY;  Surgeon: Lucilla Lame, MD;  Location: [place];  Service: Gastroenterology;;   VASECTOMY  1996     Home Medications:  Prior to Admission medications   Medication Sig Start Date End Date Taking? Authorizing Provider  amLODipine (NORVASC) 10 MG tablet TAKE 1 TABLET BY MOUTH EVERY DAY 10/12/22   Birdie Sons, MD  aspirin EC 81 MG tablet Take 81 mg by mouth daily.    [provider]  atenolol-chlorthalidone (TENORETIC) 50-25 MG tablet TAKE 1/2 TABLET BY MOUTH EVERY DAY 12/24/21   Birdie Sons, MD  atorvastatin (LIPITOR) 80 MG tablet Take 1 tablet (80 mg total) by mouth daily. 10/05/22   Birdie Sons, MD  benazepril-hydrochlorthiazide (LOTENSIN HCT) 20-25 MG tablet TAKE 1 TABLET BY MOUTH EVERY DAY 05/13/22   Birdie Sons, MD  BIOTIN PO Take by mouth.    [provider]  citalopram (CELEXA) 20 MG tablet TAKE 1 TABLET BY MOUTH EVERY DAY 10/11/22   Birdie Sons, MD  Coenzyme Q10 (COQ10 PO) Take by mouth.    [provider]  fluconazole (DIFLUCAN) 150 MG tablet TAKE 1 TABLET BY MOUTH WEEKLY AS NEEDED 12/07/22   Birdie Sons, MD  GLUCOSAMINE-CHONDROITIN PO Take by mouth.    [provider]  ketoconazole (NIZORAL) 2 % cream APPLY TOPICALLY DAILY AS NEEDED FOR IRRITATION. APPLY EXTERNALLY AS NEEDED 09/22/16   Birdie Sons, MD  LORazepam (ATIVAN) 0.5 MG tablet TAKE 1 TABLET BY MOUTH EVERY NIGHT AT BEDTIME 12/31/16   Birdie Sons, MD  Multiple Vitamin (MULTIVITAMIN) tablet Take 1 tablet by mouth daily.    [provider]  Omega-3 Fatty Acids (FISH OIL PO) Take by mouth.    [provider]  sildenafil (VIAGRA) 50 MG tablet TAKE 1 TABLET BY MOUTH DAILY AS NEEDED FOR ERECTILE DYSFUNCTION. 09/26/22   Birdie Sons, MD    Inpatient Medications: Scheduled Meds:  aspirin  300 mg Rectal Once   famotidine  20 mg Per Tube BID   heparin  4,000 Units Intravenous Once   sodium bicarbonate       Continuous Infusions:  sodium chloride     epinephrine     heparin     PRN Meds: docusate sodium, polyethylene glycol, sodium bicarbonate  Allergies:   No Known Allergies  Social History:   Social History   Socioeconomic History   Marital status: Divorced    Spouse name: Not on file   Number of children: 2   Years of education: Not on file   Highest education level: Not on file  Occupational History   Occupation: Employed    Employer: [employer]    Comment: full time  Tobacco Use   Smoking status: Never   Smokeless tobacco: Never  Vaping Use   Vaping Use: Never used  Substance and Sexual Activity   Alcohol use: Yes    Alcohol/week: 18.0 standard drinks of alcohol    Types: 18 Standard drinks or equivalent per week    Comment: 1 case of beer per week (18-24 beers)   Drug use: No   Sexual activity: Not on file  Other Topics Concern   Not on file  Social History Narrative   Not on file   Social Determinants of Health   Financial Resource Strain: Not on file  Food Insecurity: Not on file  Transportation Needs: Not on file  Physical Activity: Not on file  Stress: Not on file  Social Connections: Not on file  Intimate Partner Violence: Not on file    Family History:    Family History  Problem Relation Age of Onset   CAD Father      ROS:  Please see the history  of present illness.   All other ROS reviewed and negative.     Physical Exam/Data:   Vitals:   12/15/2022 2345 12/31/2022 0000 2022/12/31 0002 12/31/22 0015  BP: (!) 206/109 105/71  119/66  Pulse: 92 68  67  Resp: (!) 23 (!) 23  15  SpO2: 92% 90%  98%  Weight:   106.6 kg    No intake or output data in the 24 hours ending 2022-12-31 0029    12/31/22   12:02 AM 08/18/2022    9:09 AM 02/17/2022    1:20 PM  Last 3 Weights  Weight (lbs) 235 lb 233 lb 234 lb  Weight (kg) 106.595 kg 105.688 kg 106.142 kg     Body mass index is 31.87 kg/m.  General:   Intubated and not responsive HEENT: normal Neck: no JVD Vascular: No carotid bruits; Distal pulses 2+ bilaterally Cardiac:  normal S1, S2; RRR; no murmur  Lungs:  clear to auscultation bilaterally, no wheezing, rhonchi or rales  Abd: soft, nontender, no hepatomegaly  Ext: no edema Musculoskeletal:  No deformities, BUE and BLE strength normal and equal Skin: warm and dry  Neuro:   Unresponsive. Psych:   Unresponsive  EKG:  The EKG was personally reviewed and demonstrates: Sinus rhythm with 1 to 2 mm of inferior ST elevation.  Most recent EKG showed resolution of ST changes. Telemetry:  Telemetry was personally reviewed and demonstrates:    Relevant CV Studies:   Laboratory Data:  High Sensitivity Troponin:  No results for input(s): "TROPONINIHS" in the last 720 hours.   ChemistryNo results for input(s): "NA", "K", "CL", "CO2", "GLUCOSE", "BUN", "CREATININE", "CALCIUM", "MG", "GFRNONAA", "GFRAA", "ANIONGAP" in the last 168 hours.  No results for input(s): "PROT", "ALBUMIN", "AST", "ALT", "ALKPHOS", "BILITOT" in the last 168 hours. Lipids No results for input(s): "CHOL", "TRIG", "HDL", "LABVLDL", "LDLCALC", "CHOLHDL" in the last 168 hours.  Hematology Recent Labs  Lab 12/18/2022 2350  WBC 14.5*  RBC 5.47  HGB 16.3  HCT 50.0  MCV 91.4  MCH 29.8  MCHC 32.6  RDW 12.4  PLT 332   Thyroid No results for input(s): "TSH", "FREET4" in  the last 168 hours.  BNPNo results for input(s): "BNP", "PROBNP" in the last 168 hours.  DDimer No results for input(s): "DDIMER" in the last 168 hours.   Radiology/Studies:  No results found.   Assessment and Plan:   Out-of-hospital cardiac arrest in the setting of cocaine use: Suspect a cardiac event including myocardial infarction.  Based on his EKG, possibly inferior STEMI but most recent EKG showed resolution of ST changes.  Given his prolonged CPR time, his neurologic prognosis is extremely poor.  Cardiac catheterization at this time will not make a significant difference in his outcome.  I recommend supportive care with aspirin daily and unfractionated heparin drip for at least 48 hours.  Recommend high-dose statin.  Obtain an echocardiogram.  Prolonged discussion with the patient and family conducted by Dr. Karma Greaser and myself.  High risk for recurrent cardiac arrest and we asked him to consider addressing his CODE STATUS.  The patient's cardiologist is Dr. Clayborn Bigness.  Recommend consulting [facility] cardiology in AM.    For questions or updates, please contact [place] Please consult [email or web address] for contact info under    Signed, Kathlyn Sacramento, MD  01-12-23 12:29 AM

## 2022-12-28 NOTE — Progress Notes (Signed)
   Jan 03, 2023 1200  Clinical Encounter Type  Visited With Patient and family together  Visit Type Initial;Patient actively dying  Referral From Nurse  Consult/Referral To [place] responded to call to provide EOL care to family. Chaplain encountered mom, brother and niece at bedside and other family members in ICU waiting room. Chaplain gave support through compassionate presence and meaningful conversation.

## 2022-12-28 NOTE — Progress Notes (Signed)
CDS notified

## 2022-12-28 NOTE — Progress Notes (Signed)
FIO2 reduced to .50

## 2022-12-28 NOTE — Consult Note (Signed)
Pharmacy Antibiotic Note  Sergio King is a 62 y.o. male admitted on 12/14/2022 with aspiration pneumonia. PMH significant for CAD (s/p PCI/DES to mid LAD 2013), bradycardia, HTN, HLD, cocaine abuse. Patient presented to the ED with out of hospital PEA cardiac arrest in the setting of Cocaine intoxication. Now admitted to ICU and on mechanical ventilation. Pharmacy has been consulted for Unasyn dosing.  Plan: Day 1 of antibiotics Initiate Unasyn 3g IV Q6H Continue to monitor renal function and follow culture results   Height: '5\' 11"'$  (180.3 cm) Weight: 106.5 kg (234 lb 12.6 oz) IBW/kg (Calculated) : 75.3  Temp (24hrs), Avg:96.5 F (35.8 C), Min:95.5 F (35.3 C), Max:97.7 F (36.5 C)  Recent Labs  Lab 12/26/2022 2350 12-23-2022 0130 2022/12/23 0309  WBC 14.5*  --  18.6*  CREATININE 1.79*  --  1.51*  LATICACIDVEN  --  8.7* 7.2*    Estimated Creatinine Clearance: 63.8 mL/min (A) (by C-G formula based on SCr of 1.51 mg/dL (H)).    No Known Allergies  Antimicrobials this admission: 12/24 Unasyn >>   Dose adjustments this admission: N/A  Microbiology results: 12/24 MRSA PCR: negative  Thank you for allowing pharmacy to be a part of this patient's care.  Gretel Acre, PharmD PGY1 Pharmacy Resident Dec 23, 2022 7:13 AM

## 2022-12-28 NOTE — Progress Notes (Signed)
Pt. Extubated to room air. 

## 2022-12-28 NOTE — H&P (Addendum)
NAME:  Sergio King, MRN:  [id number], DOB:  Apr 28, 1961, LOS: 0 ADMISSION DATE:  12/12/2022, CONSULTATION DATE:  Jan 18, 2023 REFERRING MD: Hinda Kehr  CHIEF COMPLAINT:  Cardiac Arrest    HPI  62 y.o with significant PMH of CAD, s/p PCI/DES to mid LAD (2013), bradycardia, hypertension, hyperlipidemia,and cocaine abuse who presented to the ED with out of hospital cardiac arrest.  Per ED reports, patient was at a party where it was reported that multiple people were using cocaine. Patient stepped outside but did not return.After about 5 minutes he was found outside unresponsive and EMS called. On EMS arrival, pt was found to be bradycardic with HR in the 30's with organized electrical activity (PEA) but without a pulse. High quality CPR was initiated for a total of 4 rounds of Epi  and intubated with ROSC achieved however he was brady with HR's in the 30s therefore Levo was initiated. Code STEMI was initiated, patient placed on external pacer and transported to the ED.   ED Course:  On arrival to the ED, patient was being paced with un palpable pulses therefore CPR was initiated. Patient received a total of 2 Epi, and cardioverted for Vtach  at 200 J with ROSC achieved and organized rhythm ST 106. Amiodarone bolus 150 mg x 2 administered and patient started on Levophed gtt. Initial vital signs showed HR of 92 beats/minute, BP 206/109 mm Hg, the RR 23 breaths/minute, and the oxygen saturation 92 % on and a temperature of 95.37F (35.4C). STEMI on call was consulted who evaluated pt at the bedside and determined pt not a candidate for intervention. PCCM consulted for admission.  Pertinent Labs/Diagnostics Findings: Chemistry:Na+/ K+: 134/2.8 Glucose: 339 BUN/Cr.:22/1.79  AST/ALT:309/339 CBC: WBC: 14.5  Other Lab findings: Lactic acid:8.7, Troponin:71~368   Arterial Blood Gas result:  pO2 96; pCO2 50; pH 7.05;  HCO3 16.6, %O2 Sat 96.6.  Imaging:  CXR> CTH> pending CTA Chest>pending  CT  Abd/pelvis>pending  Past Medical History  CAD, s/p PCI/DES to mid LAD (2013), bradycardia, hypertension, hyperlipidemia,   Significant Hospital Events   12/23: Presented to the ED with out of hospital PEA cardiac arrest in the setting of Cocaine intoxication 12/24. Admit to ICU  Consults:  Cardiology Neurology  Procedures:  12/24: Arterial Line 12/24: Right IJ Central Line  Significant Diagnostic Tests:  12/24: Chest Xray> 12/24: Noncontrast CT head> 12/24: CTA abdomen and pelvis> 12/24: CTA Chest>  Micro Data:  12/24: Blood culture x2> 12/24: Urine Culture> 12/24: MRSA PCR>>   Antimicrobials:  Unasyn 12/24>  OBJECTIVE  Blood pressure 118/68, pulse 65, resp. rate 17, weight 106.6 kg, SpO2 98 %.    Vent Mode: PRVC FiO2 (%):  [100 %] 100 % Set Rate:  [20 bmp] 20 bmp Vt Set:  [500 mL] 500 mL PEEP:  [5 cmH20] 5 cmH20  No intake or output data in the 24 hours ending January 62, 2024 0203 Filed Weights   18-Jan-2023 0002  Weight: 106.6 kg   Physical Examination  GENERAL: 62 year-old critically ill patient lying in the bed intubated and sedated  EYES: Pupils equal, round, reactive to light and accommodation. No scleral icterus. Extraocular muscles intact.  HEENT: Head atraumatic, normocephalic. Oropharynx and nasopharynx clear.  NECK:  Supple, no jugular venous distention. No thyroid enlargement, no tenderness.  LUNGS: Normal breath sounds bilaterally, no wheezing, rales,rhonchi or crepitation. No use of accessory muscles of respiration.  CARDIOVASCULAR: S1, S2 normal. No murmurs, rubs, or gallops.  ABDOMEN: Soft, nontender, nondistended. Bowel sounds  present. No organomegaly or mass.  EXTREMITIES: Upper and lower extremities are atraumatic in appearance without tenderness or deformity. No swelling or erythema.. Capillary refill > 3 seconds in all extremities. Pulses palpable.  NEUROLOGIC:Does not respond to confrontation bilaterally, pupils right 2 mm, left 2 mm,and non reactive  bilaterally. III,IV,VI: doll's response absent bilaterally.V,VII: corneal reflex absent  bilaterally VIII: patient does not respond to verbal stimuli IX,X: gag reflex absent, XI: trapezius strength unable to test bilaterally XII: tongue strength unable to test. Extremities flaccid throughout.  No spontaneous movement noted.  No purposeful movements noted. Does not respond to noxious stimuli in any extremity. SKIN: No obvious rash, lesion, or ulcer.   Labs/imaging that I havepersonally reviewed  (right click and "Reselect all SmartList Selections" daily)     Labs   CBC: Recent Labs  Lab 11/27/2022 2350  WBC 14.5*  NEUTROABS 6.3  HGB 16.3  HCT 50.0  MCV 91.4  PLT 716    Basic Metabolic Panel: Recent Labs  Lab 12/05/2022 2350  NA 134*  K 2.8*  CL 96*  CO2 18*  GLUCOSE 339*  BUN 22  CREATININE 1.79*  CALCIUM 8.7*   GFR: Estimated Creatinine Clearance: 54.7 mL/min (A) (by C-G formula based on SCr of 1.79 mg/dL (H)). Recent Labs  Lab 12/09/2022 2350  WBC 14.5*    Liver Function Tests: Recent Labs  Lab 12/02/2022 2350  AST 309*  ALT 339*  ALKPHOS 101  BILITOT 1.3*  PROT 7.1  ALBUMIN 4.3   No results for input(s): "LIPASE", "AMYLASE" in the last 168 hours. No results for input(s): "AMMONIA" in the last 168 hours.  ABG    Component Value Date/Time   PHART 7.05 (LL) 12/06/2022 2354   PCO2ART 50 (H) 12/08/2022 2354   PO2ART 96 12/08/2022 2354   HCO3 13.8 (L) 12/11/2022 2354   ACIDBASEDEF 16.6 (H) 11/28/2022 2354   O2SAT 96.6 11/28/2022 2354     Coagulation Profile: Recent Labs  Lab 12/01/2022 2350  INR 1.3*    Cardiac Enzymes: No results for input(s): "CKTOTAL", "CKMB", "CKMBINDEX", "TROPONINI" in the last 168 hours.  HbA1C: Hgb A1c MFr Bld  Date/Time Value Ref Range Status  08/19/2022 09:21 AM 5.8 (H) 4.8 - 5.6 % Final    Comment:             Prediabetes: 5.7 - 6.4          Diabetes: >6.4          Glycemic control for adults with diabetes: <7.0    08/11/2021 09:24 AM 5.8 (H) 4.8 - 5.6 % Final    Comment:             Prediabetes: 5.7 - 6.4          Diabetes: >6.4          Glycemic control for adults with diabetes: <7.0     CBG: Recent Labs  Lab 01/15/2023 0046  GLUCAP 313*    Review of Systems:   UNABLE TO OBTAIN PATIENT IS INTUBATED AND SEDATED  Past Medical History  He,  has a past medical history of Hammertoe of left foot and Hypertension.   Surgical History    Past Surgical History:  Procedure Laterality Date   CARDIAC CATHETERIZATION     2 stents placed. over 3 yrs ago. [facility]   COLONOSCOPY WITH PROPOFOL N/A 09/24/2017   Procedure: COLONOSCOPY WITH PROPOFOL;  Surgeon: Lucilla Lame, MD;  Location: [place];  Service: Gastroenterology;  Laterality: N/A;  CORONARY STENT PLACEMENT Left 10/12/2012   99% LAD and 75% first diagonal [facility]   FOOT SURGERY Right 2007   Hammer toe surgery   POLYPECTOMY  09/24/2017   Procedure: POLYPECTOMY;  Surgeon: Lucilla Lame, MD;  Location: [place];  Service: Gastroenterology;;   [place] History   reports that he has never smoked. He has never used smokeless tobacco. He reports current alcohol use of about 18.0 standard drinks of alcohol per week. He reports that he does not use drugs.   Family History   His family history includes CAD in his father.   Allergies No Known Allergies   Home Medications  Prior to Admission medications   Medication Sig Start Date End Date Taking? Authorizing Provider  amLODipine (NORVASC) 10 MG tablet TAKE 1 TABLET BY MOUTH EVERY DAY 10/12/22   Birdie Sons, MD  aspirin EC 81 MG tablet Take 81 mg by mouth daily.    [provider]  atenolol-chlorthalidone (TENORETIC) 50-25 MG tablet TAKE 1/2 TABLET BY MOUTH EVERY DAY 12/24/21   Birdie Sons, MD  atorvastatin (LIPITOR) 80 MG tablet Take 1 tablet (80 mg total) by mouth daily. 10/05/22   Birdie Sons, MD  benazepril-hydrochlorthiazide (LOTENSIN  HCT) 20-25 MG tablet TAKE 1 TABLET BY MOUTH EVERY DAY 05/13/22   Birdie Sons, MD  BIOTIN PO Take by mouth.    [provider]  citalopram (CELEXA) 20 MG tablet TAKE 1 TABLET BY MOUTH EVERY DAY 10/11/22   Birdie Sons, MD  Coenzyme Q10 (COQ10 PO) Take by mouth.    [provider]  fluconazole (DIFLUCAN) 150 MG tablet TAKE 1 TABLET BY MOUTH WEEKLY AS NEEDED 12/07/22   Birdie Sons, MD  GLUCOSAMINE-CHONDROITIN PO Take by mouth.    [provider]  ketoconazole (NIZORAL) 2 % cream APPLY TOPICALLY DAILY AS NEEDED FOR IRRITATION. APPLY EXTERNALLY AS NEEDED 09/22/16   Birdie Sons, MD  LORazepam (ATIVAN) 0.5 MG tablet TAKE 1 TABLET BY MOUTH EVERY NIGHT AT BEDTIME 12/31/16   Birdie Sons, MD  Multiple Vitamin (MULTIVITAMIN) tablet Take 1 tablet by mouth daily.    [provider]  Omega-3 Fatty Acids (FISH OIL PO) Take by mouth.    [provider]  sildenafil (VIAGRA) 50 MG tablet TAKE 1 TABLET BY MOUTH DAILY AS NEEDED FOR ERECTILE DYSFUNCTION. 09/26/22   Birdie Sons, MD    Scheduled Meds:  aspirin  300 mg Rectal Once   famotidine  20 mg Per Tube BID   fentaNYL (SUBLIMAZE) injection  25 mcg Intravenous Once   Continuous Infusions:  sodium chloride     epinephrine 2 mcg/min (12-31-2022 0102)   fentaNYL infusion INTRAVENOUS 50 mcg/hr (Dec 31, 2022 0105)   heparin 1,200 Units/hr (12-31-2022 0058)   midazolam 2 mg/hr (Dec 31, 2022 0114)   norepinephrine     norepinephrine (LEVOPHED) Adult infusion     PRN Meds:.docusate sodium, fentaNYL, midazolam, norepinephrine, polyethylene glycol   Active Hospital Problem list     Assessment & Plan:  # Out-of-Hospital PEA Cardiac Arrest  in the setting of Cocaine Use with ROSC after several rounds of CPR/Defibrillation with unknown downtime  # Inferior STEMI # Cardiogenic Shock  PMHx:CAD, s/p PCI/DES to mid LAD (2013), bradycardia, hypertension, hyperlipidemia,  Not candidate for intervention per STEMI  oncall MD, see separate note -Normothermia protocol -Vasopressors as needed to maintain MAP goal  -Trend HS Troponin until peaked  - Aspirin 300 mg once, then  81 mg/day - Heparin gtt per ACS protocol - Atorvastatin '80mg'$  PO daily - Cardiology consult - Obtain 2D Echo  # Acute Hypoxic Hypercapnic Respiratory Failure # Possible aspiration event - full vent support - Wean PEEP and FiO2 for sats greater than 90% - pulmonary hygiene - Follow chest x-ray, ABG prn.  - BCx, resp cx, unasyn for 5d course - prn fentanyl, versed for RASS -1    # Acute toxic metabolic encephalopathy # Suspected anoxic brain injury # Suspected Post-Hypoxic Myoclonus Patient noted with intermitted generalized abrupt, irregular contractions of muscles  - Obtain CTH and EEG for prognostication once stable - We will give Keppra load 1.5 g and continue maintenance 500 mg twice daily - keep sedation light as able though may be limited in setting of his myoclonus/possible sz activity - Seizure precautions - Neuro consult    # AKI likely ATN in the setting of above # Hypokalemia # Anion gap metabolic acidosis with Lactic Acidosis -Trend Lactate - Monitor I&O's / urinary output - Follow BMP - Ensure adequate renal perfusion - Avoid nephrotoxic agents as able - Replace electrolytes as indicated   # Transaminitis ?early ischemic.  -Likely Shock Liver - trend    # Hyperglycemia -CBG's q4; Target range of 140 to 180 -SSI -Follow ICU Hypo/Hyperglycemia protocol   Best practice:  Diet:  NPO Pain/Anxiety/Delirium protocol (if indicated): Yes (RASS goal -1) VAP protocol (if indicated): Yes DVT prophylaxis: Systemic AC GI prophylaxis: PPI Glucose control:  SSI Yes Central venous access:  Yes, and it is still needed Arterial line:  Yes, and it is still needed Foley:  Yes, and it is still needed Mobility:  bed rest  PT consulted: Yes Last date of multidisciplinary goals of care discussion [12/24] Code  Status:  full code Disposition: ICU   = Goals of Care = Code Status Order: FULL  Primary Emergency Contact: Strassman,emily Wishes to pursue full aggressive treatment and intervention options, including CPR and intubation, but goals of care will be addressed on going with family if that should become necessary.  Critical care time: 45 minutes       [name], DNP, CCRN, FNP-C, AGACNP-BC Acute Care Nurse Practitioner [facility]  PCCM on call pager [phone] until 7 am

## 2022-12-28 NOTE — Death Summary Note (Signed)
DEATH SUMMARY   Patient Details  Name: Sergio King MRN: [id number] DOB: August 29, 1961  Admission/Discharge Information   Admit Date:  01-15-23  Date of Death: 01-16-2023   Time of Death: 04-06-35   Length of Stay: 0  Referring Physician: Birdie Sons, MD   Reason(s) for Hospitalization  Cardiac Arrest, STEMI  Diagnoses  Preliminary cause of death:  Secondary Diagnoses (including complications and co-morbidities):  Principal Problem:   Cardiac arrest [facility]) Active Problems:   Goals of care, counseling/discussion   ST elevation myocardial infarction (STEMI) ([place])   AKI (acute kidney injury) ([place])   Brain swelling ([place])   Neurogenic shock [facility])   Brief Hospital Course (including significant findings, care, treatment, and services provided and events leading to death)  Sergio King is a 62 y.o. year old male who presented to the hospital following a cardiac arrest. He was found down by his friends and underwent CPR in the field, then by EMS, then in the ED. Concern for ACS/STEMI, however given prolonged CPR of 90 minutes, this was deferred. He was admitted to the ICU intubated for further care with TTM. Patient was having myoclonus overnight. Head CT notable for diffuse anoxic brain injury. His physical exam progressed. After goals of care conversations with family given futility of care, the patient was compassionately extubated. Patient's case accepted by the medical examiner.   Pertinent Labs and Studies  Significant Diagnostic Studies CT Angio Chest Pulmonary Embolism (PE) W or WO Contrast  Result Date: 16-Jan-2023 CLINICAL DATA:  Pulmonary embolism suspected, high probability EXAM: CT ANGIOGRAPHY CHEST WITH CONTRAST TECHNIQUE: Multidetector CT imaging of the chest was performed using the standard protocol during bolus administration of intravenous contrast. Multiplanar CT image reconstructions and MIPs were obtained to evaluate the vascular anatomy. RADIATION DOSE  REDUCTION: This exam was performed according to the departmental dose-optimization program which includes automated exposure control, adjustment of the mA and/or kV according to patient size and/or use of iterative reconstruction technique. CONTRAST:  76m OMNIPAQUE IOHEXOL 350 MG/ML SOLN COMPARISON:  None Available. FINDINGS: Cardiovascular: Satisfactory opacification of the pulmonary arteries to the segmental level. No evidence of pulmonary embolism. Normal heart size. No pericardial effusion. Coronary atherosclerosis seen along the LAD and circumflex Mediastinum/Nodes: Negative Lungs/Pleura: Dependent atelectasis. No edema, effusion, or pneumothorax. Located endotracheal tube Upper Abdomen: No acute finding.  Located enteric tube Musculoskeletal: Bridging thoracic osteophytes. Review of the MIP images confirms the above findings. IMPRESSION: 1. Negative for pulmonary embolism. 2. Low volume chest with atelectasis. 3. Coronary atherosclerosis. Electronically Signed   By: JJorje GuildM.D.   On: 1Jan 20, 202409:46   CT HEAD WO CONTRAST (5MM)  Result Date: 101/20/24CLINICAL DATA:  Neuro deficit with stroke suspected.  Cardiac arrest EXAM: CT HEAD WITHOUT CONTRAST TECHNIQUE: Contiguous axial images were obtained from the base of the skull through the vertex without intravenous contrast. RADIATION DOSE REDUCTION: This exam was performed according to the departmental dose-optimization program which includes automated exposure control, adjustment of the mA and/or kV according to patient size and/or use of iterative reconstruction technique. COMPARISON:  None Available. FINDINGS: Brain: Generalized cytotoxic edema in the cortex and cerebellum, also involving the deep gray nuclei symmetrically. Diffuse swelling with narrowing/effacement of sulci and crowding of the basal cisterns. No shift or transforaminal herniation. Vascular: Prominent vessels from brain edema. Skull: Normal. Negative for fracture or focal  lesion. Sinuses/Orbits: No acute finding. IMPRESSION: Severe global anoxic injury with generalized brain swelling. Electronically Signed   By: JJorje Guild  M.D.   On: 27-Dec-2022 09:23   US Venous Img Lower Bilateral (DVT)  Result Date: 12/27/22 CLINICAL DATA:  Lower extremity edema. EXAM: BILATERAL LOWER EXTREMITY VENOUS DOPPLER ULTRASOUND TECHNIQUE: Gray-scale sonography with graded compression, as well as color Doppler and duplex ultrasound were performed to evaluate the lower extremity deep venous systems from the level of the common femoral vein and including the common femoral, femoral, profunda femoral, popliteal and calf veins including the posterior tibial, peroneal and gastrocnemius veins when visible. The superficial great saphenous vein was also interrogated. Spectral Doppler was utilized to evaluate flow at rest and with distal augmentation maneuvers in the common femoral, femoral and popliteal veins. COMPARISON:  None Available. FINDINGS: RIGHT LOWER EXTREMITY Common Femoral Vein: No evidence of thrombus. Normal compressibility, respiratory phasicity and response to augmentation. Saphenofemoral Junction: No evidence of thrombus. Normal compressibility and flow on color Doppler imaging. Profunda Femoral Vein: No evidence of thrombus. Normal compressibility and flow on color Doppler imaging. Femoral Vein: No evidence of thrombus. Normal compressibility, respiratory phasicity and response to augmentation. Popliteal Vein: No evidence of thrombus. Normal compressibility, respiratory phasicity and response to augmentation. Calf Veins: No evidence of thrombus. Normal compressibility and flow on color Doppler imaging. Superficial Great Saphenous Vein: No evidence of thrombus. Normal compressibility. Venous Reflux:  None. Other Findings: No evidence of superficial thrombophlebitis or abnormal fluid collection. LEFT LOWER EXTREMITY Common Femoral Vein: No evidence of thrombus. Normal compressibility,  respiratory phasicity and response to augmentation. Saphenofemoral Junction: No evidence of thrombus. Normal compressibility and flow on color Doppler imaging. Profunda Femoral Vein: No evidence of thrombus. Normal compressibility and flow on color Doppler imaging. Femoral Vein: No evidence of thrombus. Normal compressibility, respiratory phasicity and response to augmentation. Popliteal Vein: No evidence of thrombus. Normal compressibility, respiratory phasicity and response to augmentation. Calf Veins: No evidence of thrombus. Normal compressibility and flow on color Doppler imaging. Superficial Great Saphenous Vein: No evidence of thrombus. Normal compressibility. Venous Reflux:  None. Other Findings: No evidence of superficial thrombophlebitis or abnormal fluid collection. IMPRESSION: No evidence of deep venous thrombosis in either lower extremity. Electronically Signed   By: Aletta Edouard M.D.   On: 12-27-22 09:15   DG Chest 1 View  Result Date: 2022-12-27 CLINICAL DATA:  NG tube placement and central venous catheter placement EXAM: CHEST  1 VIEW COMPARISON:  Radiographs Dec 27, 2022 at 12:06 a.m. FINDINGS: Endotracheal tube tip in the intrathoracic trachea 5.2 cm from the carina. Right IJ CVC tip in the mid SVC. Subdiaphragmatic enteric tube. Defibrillator pads. Cardiomegaly. No focal consolidation, pleural effusion, or pneumothorax. The previous right perihilar/suprahilar density is no longer visualized. No acute osseous abnormality. Elevated right hemidiaphragm. IMPRESSION: Lines and tubes in good position. Cardiomegaly. Electronically Signed   By: Placido Sou M.D.   On: 2022-12-27 02:34   DG Abd Portable 1V  Result Date: 12/27/22 CLINICAL DATA:  NG tube placement central venous catheter placement EXAM: PORTABLE ABDOMEN - 1 VIEW COMPARISON:  None Available. FINDINGS: Enteric tube tip and side port within the stomach. Defibrillator pad. Nonspecific bowel-gas pattern in the upper abdomen.  IMPRESSION: NG tube in good position. Electronically Signed   By: Placido Sou M.D.   On: December 27, 2022 02:22   DG Chest Port 1 View  Result Date: 2022/12/27 CLINICAL DATA:  Post intubation. EXAM: PORTABLE CHEST 1 VIEW COMPARISON:  Chest radiograph dated 10/14/2012. FINDINGS: Endotracheal tube with tip approximately 2.5 cm above the carina. There is shallow inspiration. Right perihilar and suprahilar density is suboptimally evaluated but  may represent atelectasis or infiltrate. A hilar mass or adenopathy is not excluded. Further evaluation with CT is recommended. No pleural effusion or pneumothorax. Top-normal cardiac silhouette no acute osseous pathology. IMPRESSION: 1. Endotracheal tube above the carina. 2. Indeterminate right perihilar/suprahilar density. Further evaluation with CT is recommended. Electronically Signed   By: Anner Crete M.D.   On: 04-Jan-2023 00:28    Microbiology Recent Results (from the past 240 hour(s))  MRSA Next Gen by PCR, Nasal     Status: None   Collection Time: Jan 04, 2023  2:45 AM   Specimen: Nasal Mucosa; Nasal Swab  Result Value Ref Range Status   MRSA by PCR Next Gen NOT DETECTED NOT DETECTED Final    Comment: (NOTE) The GeneXpert MRSA Assay (FDA approved for NASAL specimens only), is one component of a comprehensive MRSA colonization surveillance program. It is not intended to diagnose MRSA infection nor to guide or monitor treatment for MRSA infections. Test performance is not FDA approved in patients less than 91 years old. Performed at [facility], [address] [place], [place] [id number]     Lab Basic Metabolic Panel: Recent Labs  Lab 12/12/2022 2350 2023/01/04 0309  NA 134* 135  K 2.8* 3.3*  CL 96* 98  CO2 18* 19*  GLUCOSE 339* 258*  BUN 22 25*  CREATININE 1.79* 1.51*  CALCIUM 8.7* 8.6*  MG  --  2.4  PHOS  --  4.4   Liver Function Tests: Recent Labs  Lab 12/23/2022 2350  AST 309*  ALT 339*  ALKPHOS 101  BILITOT 1.3*   PROT 7.1  ALBUMIN 4.3   No results for input(s): "LIPASE", "AMYLASE" in the last 168 hours. No results for input(s): "AMMONIA" in the last 168 hours. CBC: Recent Labs  Lab 12/03/2022 2350 01/04/23 0309  WBC 14.5* 18.6*  NEUTROABS 6.3  --   HGB 16.3 16.0  HCT 50.0 46.5  MCV 91.4 85.3  PLT 332 374   Cardiac Enzymes: No results for input(s): "CKTOTAL", "CKMB", "CKMBINDEX", "TROPONINI" in the last 168 hours. Sepsis Labs: Recent Labs  Lab 12/15/2022 2350 January 04, 2023 0130 January 04, 2023 0309 01-04-23 0843 01/04/23 1340  PROCALCITON  --   --  1.22  --   --   WBC 14.5*  --  18.6*  --   --   LATICACIDVEN  --  8.7* 7.2* 2.9* 2.0*     Ryland Smoots 2023-01-04, 4:39 PM

## 2022-12-28 NOTE — Progress Notes (Addendum)
ANTICOAGULATION CONSULT NOTE - Initial Consult  Pharmacy Consult for Heparin  Indication: chest pain/ACS  No Known Allergies  Patient Measurements: Weight: 106.6 kg (235 lb) Heparin Dosing Weight: 99.9 kg   Vital Signs: BP: 206/109 (12/23 2345) Pulse Rate: 92 (12/23 2345)  Labs: Recent Labs    12/09/2022 2350  HGB 16.3  HCT 50.0  PLT 332    CrCl cannot be calculated (Patient's most recent lab result is older than the maximum 21 days allowed.).   Medical History: Past Medical History:  Diagnosis Date   Hammertoe of left foot    Hypertension     Medications:  (Not in a hospital admission)   Assessment: Pharmacy consulted to dose heparin in this 62 year old male admitted with ACS/NSTEMI.  No prior anticoag noted. CrCl = ?   Goal of Therapy:  Heparin level 0.3-0.7 units/ml Monitor platelets by anticoagulation protocol: Yes   Plan:  Heparin 4000 units SQ X 1 already given by EMS.  Start heparin infusion at 1200 units/hr Check anti-Xa level in 6 hours and daily while on heparin Continue to monitor H&H and platelets  Shaquera Ansley D December 25, 2022,12:09 AM

## 2022-12-28 NOTE — ED Provider Notes (Signed)
[facility] Provider Note    Event Date/Time   First MD Initiated Contact with Patient 12/27/2022  @ 2322   (approximate)   History   Cardiac Arrest   HPI Level 5 caveat:  history/ROS limited by acute/critical illness  Sergio King is a 62 y.o. male who reportedly has a prior cardiac history including 2 prior stent placements years ago and who also reportedly uses cocaine at least occasionally (allegedly including tonight).  The patient was reportedly at a house where multiple people were having a party and allegedly using cocaine.  He reportedly went outside and then people noticed he did not return.  After about 5 minutes they found him unresponsive outside.  They called EMS.  First responders and then paramedics arrived and found that he had a heart rate in the 30s with organized electrical activity (PEA) but no palpable pulse.  CPR was initiated and they did a total of 4 rounds of epinephrine with chest compressions and intubation with ET tube and good bilateral breath sounds.  They were able to get a return of spontaneous circulation with a palpable pulse but a heart rate that remained in the 30s, so they started a Levophed infusion and started external pacing.  He was called as a code STEMI in the field, relayed via [place], but the EKG never was never received by the emergency department nor [facility].  Upon arrival to the emergency department he was being paced with epinephrine infusion.  As we settled him in, I was unable to palpate a pulse, so we started chest compressions.  See hospital course for additional details.     Physical Exam   Triage Vital Signs: ED Triage Vitals  Enc Vitals Group     BP 12/05/2022 2345 (!) 206/109     Pulse Rate 12/07/2022 2345 92     Resp 12/22/2022 2345 (!) 23     Temp --      Temp src --      SpO2 12/10/2022 2345 92 %     Weight 01-08-2023 0002 106.6 kg (235 lb)     Height --      Head Circumference --      Peak Flow --       Pain Score --      Pain Loc --      Pain Edu? --      Excl. in [place]? --     Most recent vital signs: Vitals:   January 08, 2023 0020 2023-01-08 0025  BP: 101/63 118/68  Pulse: 66 65  Resp: 17 17  SpO2: 98% 98%    Gen: unresponsive Cardiovascular: pulseless during pulse checks, palpable femoral and carotid pulse during CPR Resp: apneic. Breath sounds equal bilaterally with bagging  Abd: Mild abdominal distention Neuro: GCS 3, unresponsive to pain, pupils fixed and dilated, no gag reflex, no corneal reflex Neck: No crepitus  Musculoskeletal: No deformity  Skin: warm  ED Results / Procedures / Treatments   Labs (all labs ordered are listed, but only abnormal results are displayed) Labs Reviewed  BLOOD GAS, ARTERIAL - Abnormal; Notable for the following components:      Result Value   pH, Arterial 7.05 (*)    pCO2 arterial 50 (*)    Bicarbonate 13.8 (*)    Acid-base deficit 16.6 (*)    All other components within normal limits  CBC WITH DIFFERENTIAL/PLATELET - Abnormal; Notable for the following components:   WBC 14.5 (*)    nRBC  0.3 (*)    Lymphs Abs 6.5 (*)    Abs Immature Granulocytes 0.76 (*)    All other components within normal limits  PROTIME-INR - Abnormal; Notable for the following components:   Prothrombin Time 16.3 (*)    INR 1.3 (*)    All other components within normal limits  APTT - Abnormal; Notable for the following components:   aPTT 37 (*)    All other components within normal limits  COMPREHENSIVE METABOLIC PANEL - Abnormal; Notable for the following components:   Sodium 134 (*)    Potassium 2.8 (*)    Chloride 96 (*)    CO2 18 (*)    Glucose, Bld 339 (*)    Creatinine, Ser 1.79 (*)    Calcium 8.7 (*)    AST 309 (*)    ALT 339 (*)    Total Bilirubin 1.3 (*)    GFR, Estimated 43 (*)    Anion gap 20 (*)    All other components within normal limits  LIPID PANEL - Abnormal; Notable for the following components:   Triglycerides 256 (*)    VLDL 51  (*)    All other components within normal limits  TROPONIN I (HIGH SENSITIVITY) - Abnormal; Notable for the following components:   Troponin I (High Sensitivity) 71 (*)    All other components within normal limits  HEMOGLOBIN A1C  MAGNESIUM  PHOSPHORUS  BLOOD GAS, ARTERIAL  BASIC METABOLIC PANEL  CBC  HIV ANTIBODY (ROUTINE TESTING W REFLEX)  LACTIC ACID, PLASMA  LACTIC ACID, PLASMA  D-DIMER, QUANTITATIVE     EKG  ED ECG REPORT #1 I, Hinda Kehr, the attending physician, personally viewed and interpreted this ECG.  Date: 12/22/2022 EKG Time: 23: 23 Rate: 195 Rhythm: Ventricular tachycardia  QRS Axis: normal Intervals: normal ST/T Wave abnormalities: ST segment elevation most notable in inferior leads and lateral leads, likely rate related as well Narrative Interpretation: Questionable STEMI, palpable pulse at the time consistent with ventricular tachycardia with pulse   ED ECG REPORT #2 I, Hinda Kehr, the attending physician, personally viewed and interpreted this ECG.  Date: 12/02/2022 EKG Time: 23: 24 Rate: 106 Rhythm: Mild sinus tachycardia QRS Axis: normal Intervals: Intraventricular conduction delay ST/T Wave abnormalities: ST segment elevation in lead III, aVR, depression in lateral leads and precordial leads Narrative Interpretation: Concerning for ischemia, questionable STEMI   ED ECG REPORT #3 I, Hinda Kehr, the attending physician, personally viewed and interpreted this ECG.  Date: 11/30/2022 EKG Time: 23: 33 Rate: 58 Rhythm: Borderline sinus bradycardia QRS Axis: normal Intervals: normal ST/T Wave abnormalities: ST segment depression in leads V3 through V6 concerning for ischemia Narrative Interpretation: Does not clearly meet STEMI criteria but are concerning for ischemia   ED ECG REPORT #4 I, Hinda Kehr, the attending physician, personally viewed and interpreted this ECG.  Date: 12/22/2022 EKG Time: 23: 41 Rate: 96 Rhythm: normal  sinus rhythm QRS Axis: normal Intervals: Nonspecific intraventricular conduction delay ST/T Wave abnormalities: ST segment elevation in lead III and aVR, ST segment elevation in V1 and V2, significant ST depression in V3 through V6 Narrative Interpretation: Ischemic EKG inferior and lateral, does not clearly meet STEMI criteria   ED ECG REPORT #5 I, Hinda Kehr, the attending physician, personally viewed and interpreted this ECG.  Date: 12/13/2022 EKG Time: 23: 56 Rate: 71 Rhythm: normal sinus rhythm QRS Axis: normal Intervals: normal ST/T Wave abnormalities: Lateral ischemia is still present but improved from prior Narrative Interpretation: no definitive evidence  of acute ischemia; does not meet STEMI criteria.   RADIOLOGY I viewed and interpreted the patient's 1 view chest x-ray.  Endotracheal tube is appropriately placed.  No evidence of pneumothorax nor pulmonary edema.  Radiology confirms endotracheal tube placement and mentions a right-sided perihilar or suprahilar density.    PROCEDURES:  Critical Care performed: Yes, see critical care procedure note(s)  CPR  Date/Time: December 27, 2022 12:34 AM  Performed by: Hinda Kehr, MD Authorized by: Hinda Kehr, MD  CPR Procedure Details:      Amount of time prior to administration of ACLS/BLS (minutes):  45   ACLS/BLS initiated by EMS: Yes     CPR/ACLS performed in the ED: Yes     Duration of CPR (minutes):  30   Outcome: ROSC obtained    CPR performed via ACLS guidelines under my direct supervision.  See RN documentation for details including defibrillator use, medications, doses and timing. Comments:     see hospital course for further details .Cardioversion  Date/Time: 12/17/2022 11:23 PM  Performed by: Hinda Kehr, MD Authorized by: Hinda Kehr, MD   Consent:    Consent obtained:  Emergent situation Pre-procedure details:    Cardioversion basis:  Emergent   Rhythm:  Ventricular tachycardia   Electrode  placement:  Anterior-posterior Patient sedated: No Attempt one:    Cardioversion mode:  Synchronous   Waveform:  Biphasic   Shock (Joules):  200   Shock outcome:  Conversion to other rhythm Post-procedure details:    Patient status:  Unresponsive   Patient tolerance of procedure:  Tolerated well, no immediate complications .Critical Care  Performed by: Hinda Kehr, MD Authorized by: Hinda Kehr, MD   Critical care provider statement:    Critical care time (minutes):  90   Critical care time was exclusive of:  Separately billable procedures and treating other patients   Critical care was time spent personally by me on the following activities:  Development of treatment plan with patient or surrogate, evaluation of patient's response to treatment, examination of patient, obtaining history from patient or surrogate, ordering and performing treatments and interventions, ordering and review of laboratory studies, ordering and review of radiographic studies, pulse oximetry, re-evaluation of patient's condition and review of old charts .1-3 Lead EKG Interpretation  Performed by: Hinda Kehr, MD Authorized by: Hinda Kehr, MD     Interpretation: abnormal     ECG rate:  105   ECG rate assessment: tachycardic     Rhythm: sinus tachycardia     Ectopy: none     Conduction: normal      MEDICATIONS ORDERED IN ED: Medications  0.9 %  sodium chloride infusion (has no administration in time range)  heparin injection 4,000 Units (has no administration in time range)  aspirin suppository 300 mg (has no administration in time range)  EPINEPHrine (ADRENALIN) 5 mg in NS 250 mL (0.02 mg/mL) premix infusion (has no administration in time range)  heparin ADULT infusion 100 units/mL (25000 units/240m) (has no administration in time range)  docusate sodium (COLACE) capsule 100 mg (has no administration in time range)  polyethylene glycol (MIRALAX / GLYCOLAX) packet 17 g (has no administration in  time range)  famotidine (PEPCID) tablet 20 mg (has no administration in time range)  sodium bicarbonate 1 mEq/mL injection (has no administration in time range)     IMPRESSION / MDM / ASSESSMENT AND PLAN / ED COURSE  I reviewed the triage vital signs and the nursing notes.  Differential diagnosis includes, but is not limited to, ACS/STEMI, cocaine induced coronary artery vasospasm, respiratory arrest, electrolyte or metabolic abnormality.  Patient's presentation is most consistent with acute presentation with potential threat to life or bodily function.  Patient arrived receiving external pacing and Levophed infusion and BVM respirations through endotracheal tube.  After transferring the patient to the ED stretcher, I could not palpate a pulse, nor could other healthcare professionals in the room.  We discontinued pacing and started CPR.  See nursing notes for additional details.  After 2 doses of epinephrine with continuous compressions, we did a pulse and rhythm check and found the patient to be in V. tach with a palpable pulse (EKG #1 above).  I performed synchronized cardioversion at 200 J biphasic and the patient converted to mild sinus tachycardia (EKG #2).  I ordered resumption of Levophed infusion assuming the patient would likely decompensate and in spite of the Levophed he initially was normotensive but then on repeat blood pressure measurements became hypotensive and then we lost a palpable pulse.  We resumed CPR for another 2 epinephrine infusions with continuous chest compressions.  At that point we had a return of spontaneous circulation and a palpable pulse.  I continued the Levophed infusion at 30 mcg/min.  Dr. Burt Knack with cardiology was on STEMI call but was unavailable.  [facility] and/or our ED secretary was able to get in touch with Dr. Fletcher Anon with whom I consulted by phone and he came to the emergency department and evaluated the patient at bedside  with me present.  I also consulted [name], ICU NP, who came to the ED to evaluate the patient and admit him to the ICU.  Given that the total amount of time from his initial unresponsive episode, multiple rounds of CPR, to the present moment was about 90 minutes with no obvious neurological function and palpable pulse but very unstable and tenuous, as well as the question of cocaine induced MI, Dr. Fletcher Anon felt that the patient was not a good candidate for catheterization at this moment.  I ordered epinephrine infusion to support blood pressure and heart rate, and this was also supported by Ms. Ouma and Dr. Fletcher Anon.  Dr. Fletcher Anon and I went with Merleen Nicely, ED charge nurse, and spoke with the patient's daughter, brother, and the daughter's fianc.  I updated them about the critical illness and grave prognosis and Dr. Fletcher Anon also talked with them from his perspective as the cardiologist.  They understand the severity of the situation and poor prognosis.  They would like the patient to remain full code at this time.  I relayed this information to the ICU team  The patient was on the cardiac monitor to evaluate for evidence of arrhythmia and/or significant heart rate changes.  Patient critical but hemodynamically stable at the time of transfer to the ICU.  Labs/studies ordered include: CMP, high-sensitivity troponin, CBC with differential, pro time-INR, APTT, ABG, lipid panel, 1 view chest x-ray.  Labs notable for AKI with creatinine of 1.79, LFT elevations, hypokalemia, hyperglycemia, leukocytosis of 14.5.  Patient is already being transferred to the unit and these issues will be addressed in the ICU.     FINAL CLINICAL IMPRESSION(S) / ED DIAGNOSES   Final diagnoses:  Cardiopulmonary arrest with successful resuscitation [facility])     Rx / DC Orders   ED Discharge Orders     None        Note:  This document was prepared using Dragon voice recognition software and  may include unintentional  dictation errors.   Hinda Kehr, MD December 21, 2022 [phone]

## 2022-12-28 DEATH — deceased

## 2023-01-31 ENCOUNTER — Other Ambulatory Visit: Payer: Self-pay | Admitting: Family Medicine

## 2023-01-31 DIAGNOSIS — I10 Essential (primary) hypertension: Secondary | ICD-10-CM
